# Patient Record
Sex: Male | Born: 1935 | Hispanic: No | Marital: Married | Smoking: Never smoker
Health system: Southern US, Community
[De-identification: ages and names within clinical notes are randomized; demographics above are authoritative.]

## PROBLEM LIST (undated history)

## (undated) DIAGNOSIS — R413 Other amnesia: Secondary | ICD-10-CM

## (undated) DIAGNOSIS — M1990 Unspecified osteoarthritis, unspecified site: Secondary | ICD-10-CM

## (undated) DIAGNOSIS — E785 Hyperlipidemia, unspecified: Secondary | ICD-10-CM

## (undated) DIAGNOSIS — I2510 Atherosclerotic heart disease of native coronary artery without angina pectoris: Secondary | ICD-10-CM

## (undated) DIAGNOSIS — I10 Essential (primary) hypertension: Secondary | ICD-10-CM

## (undated) HISTORY — PX: MITRAL VALVE SURGERY: SHX714

## (undated) HISTORY — PX: SHOULDER ARTHROSCOPY: SHX128

## (undated) HISTORY — PX: TONSILLECTOMY: SUR1361

## (undated) HISTORY — DX: Other amnesia: R41.3

## (undated) HISTORY — PX: EYE SURGERY: SHX253

## (undated) HISTORY — PX: CORONARY ARTERY BYPASS GRAFT: SHX141

---

## 1997-08-21 ENCOUNTER — Ambulatory Visit (HOSPITAL_COMMUNITY): Admission: RE | Admit: 1997-08-21 | Discharge: 1997-08-21 | Payer: Self-pay | Admitting: Infectious Diseases

## 1997-08-26 ENCOUNTER — Other Ambulatory Visit: Admission: RE | Admit: 1997-08-26 | Discharge: 1997-08-26 | Payer: Self-pay | Admitting: Infectious Diseases

## 1997-08-29 ENCOUNTER — Other Ambulatory Visit: Admission: RE | Admit: 1997-08-29 | Discharge: 1997-08-29 | Payer: Self-pay | Admitting: Infectious Diseases

## 1997-08-30 ENCOUNTER — Encounter: Admission: RE | Admit: 1997-08-30 | Discharge: 1997-08-30 | Payer: Self-pay | Admitting: Infectious Diseases

## 1997-09-11 ENCOUNTER — Other Ambulatory Visit: Admission: RE | Admit: 1997-09-11 | Discharge: 1997-09-11 | Payer: Self-pay | Admitting: Infectious Diseases

## 2000-05-17 HISTORY — PX: PROSTATE BIOPSY: SHX241

## 2000-05-17 HISTORY — PX: SHOULDER ARTHROSCOPY W/ ROTATOR CUFF REPAIR: SHX2400

## 2000-08-22 ENCOUNTER — Encounter: Payer: Self-pay | Admitting: Urology

## 2000-08-22 ENCOUNTER — Encounter: Admission: RE | Admit: 2000-08-22 | Discharge: 2000-08-22 | Payer: Self-pay | Admitting: Urology

## 2000-08-23 ENCOUNTER — Ambulatory Visit (HOSPITAL_BASED_OUTPATIENT_CLINIC_OR_DEPARTMENT_OTHER): Admission: RE | Admit: 2000-08-23 | Discharge: 2000-08-24 | Payer: Self-pay | Admitting: Urology

## 2007-04-24 ENCOUNTER — Encounter: Admission: RE | Admit: 2007-04-24 | Discharge: 2007-04-24 | Payer: Self-pay | Admitting: Gastroenterology

## 2008-07-24 IMAGING — CT CT VIRTUAL COLONOSCOPY SCREENING
2 of 5 series · 16 of 46 positions shown, 18 images · non-contrast
Comparison: None.

CLINICAL DATA: Some abdominal pain and diarrhea.  Patient on anticoagulation, i.e. Coumadin for mitral valve replacement which could not be stopped. 
CT VIRTUAL COLONOSCOPY SCREENING :
TECHNIQUE: The patient was given a standard low sodium bowel preparation with barium and Gastrografin for stool and fluid tagging respectively.  Automated 
CO2 insufflation of the colon was performed prior to scanning.  The quality of bowel preparation is excellent.  The quality of colonic distention is good with the exception of the sigmoid and portions of the descending colon where diverticulosis results in suboptimal distention.
TECHNIQUE: Multidetector CT imaging of the abdomen was performed following the standard protocol without IV contrast.
TECHNIQUE: Multidetector CT imaging of the pelvis was performed following the standard protocol without IV contrast.

[Series 7: prone · axial · 0.70mm/px · z∈[-414,+8]mm · 13 of 196 slices shown, 15 images]
[im 14/196  soft-tissue]
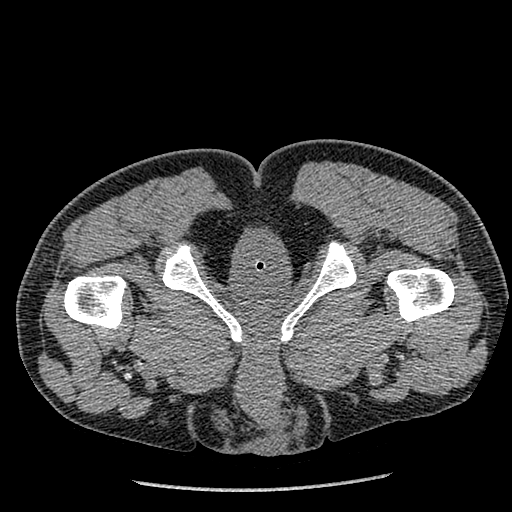
[im 14/196  bone]
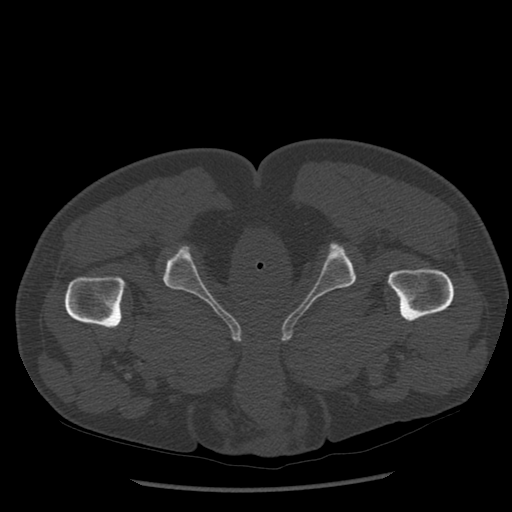
[im 27/196  soft-tissue]
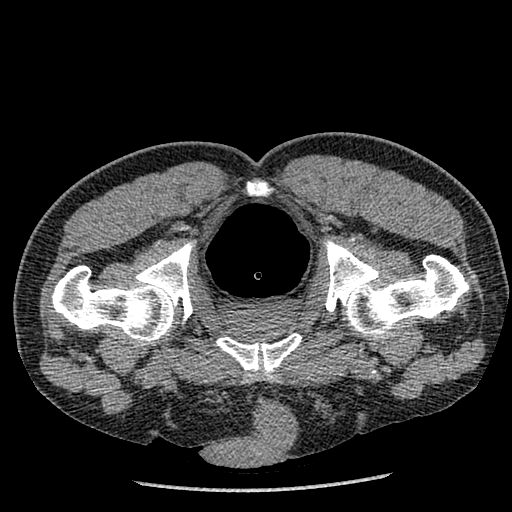
[im 40/196  soft-tissue]
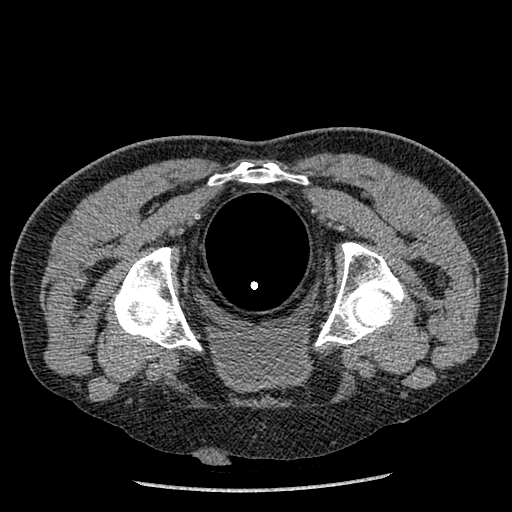
[im 53/196  soft-tissue]
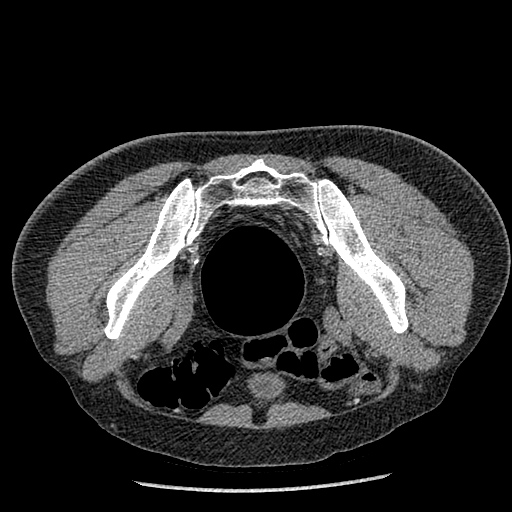
[im 66/196  soft-tissue]
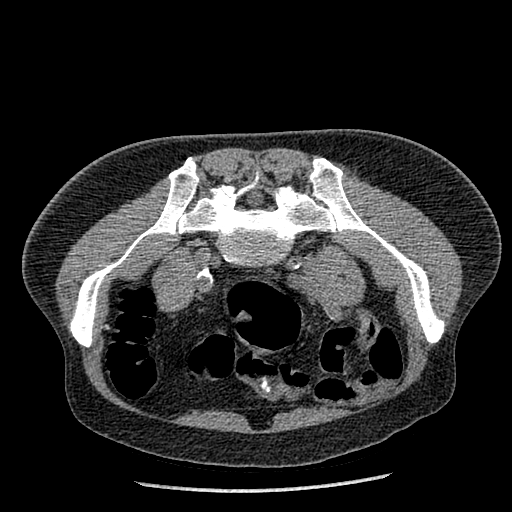
[im 79/196  soft-tissue]
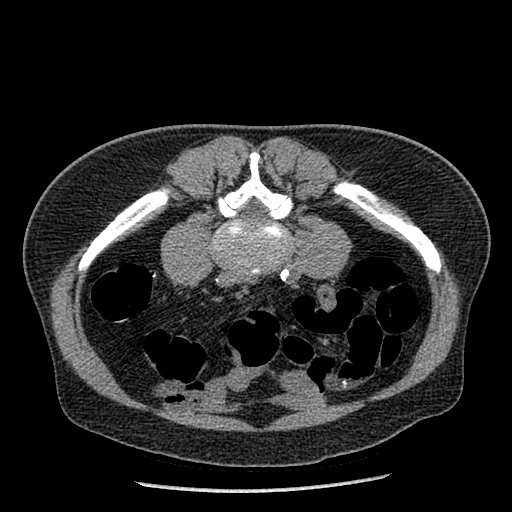
[im 105/196  soft-tissue]
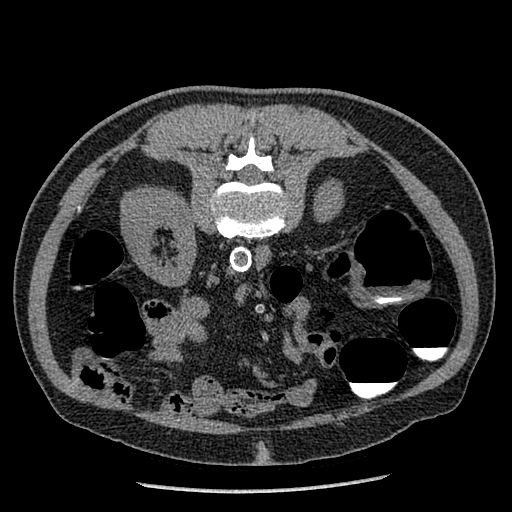
[im 118/196  soft-tissue]
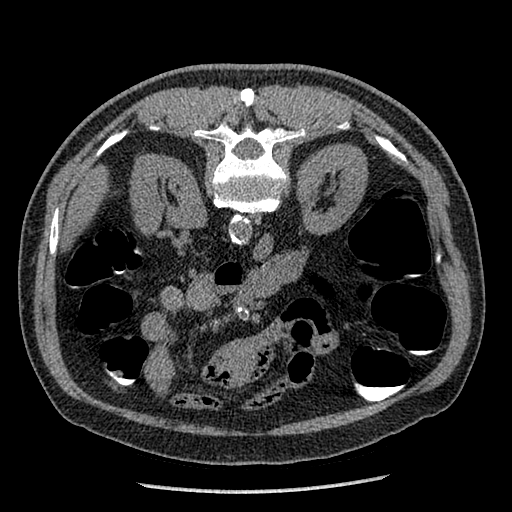
[im 131/196  soft-tissue]
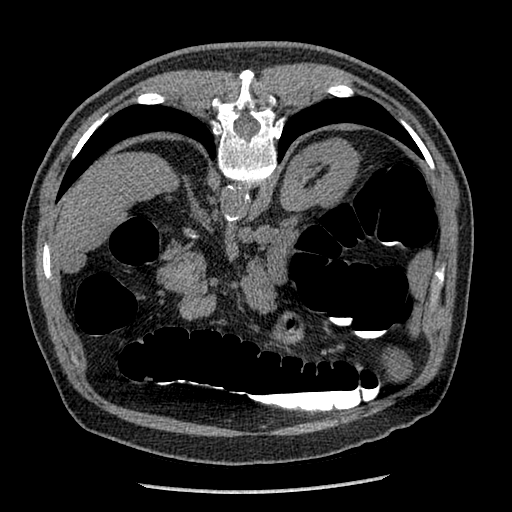
[im 131/196  bone]
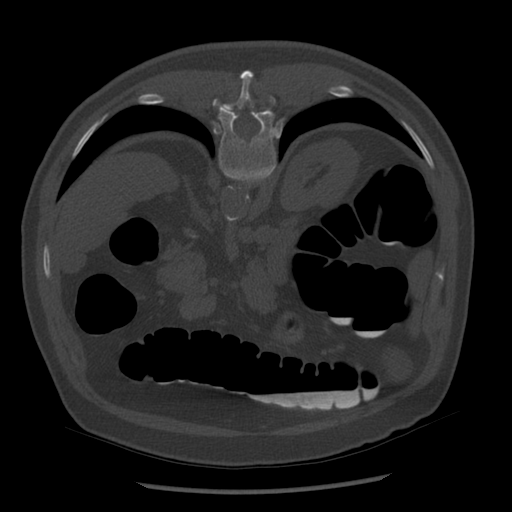
[im 144/196  soft-tissue]
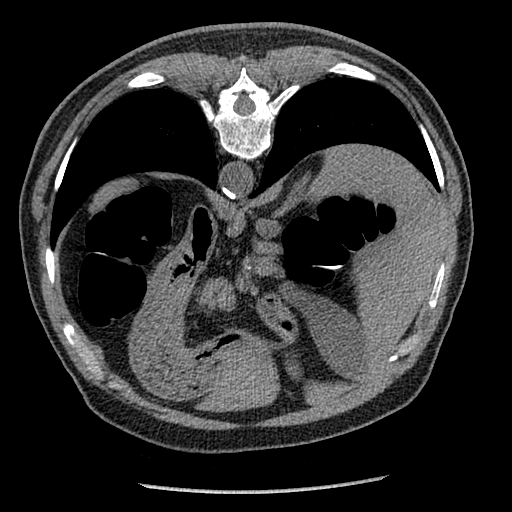
[im 157/196  soft-tissue]
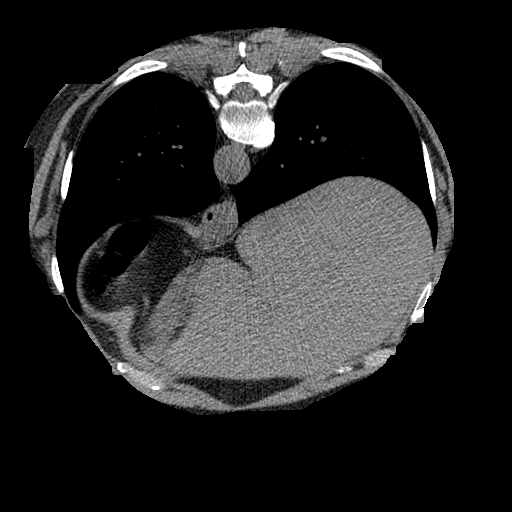
[im 170/196  soft-tissue]
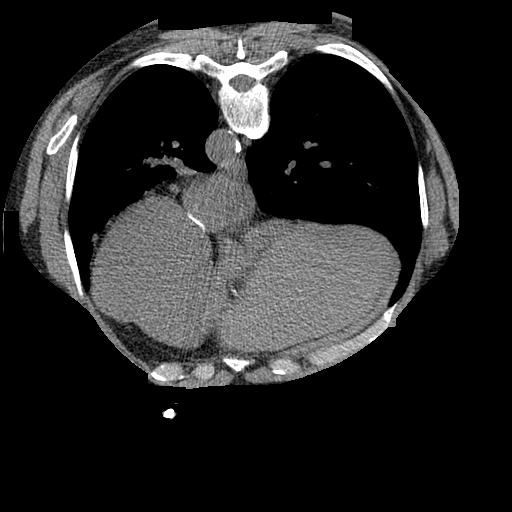
[im 183/196  soft-tissue]
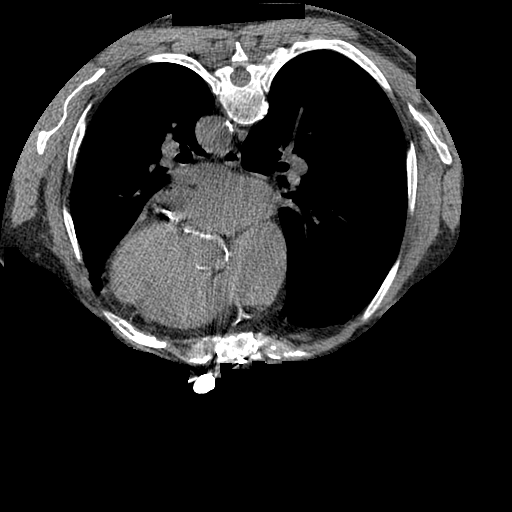

[Series 602: sagittal body · sagittal · 0.83mm/px · 3 of 142 slices shown]
[im 48/142  soft-tissue]
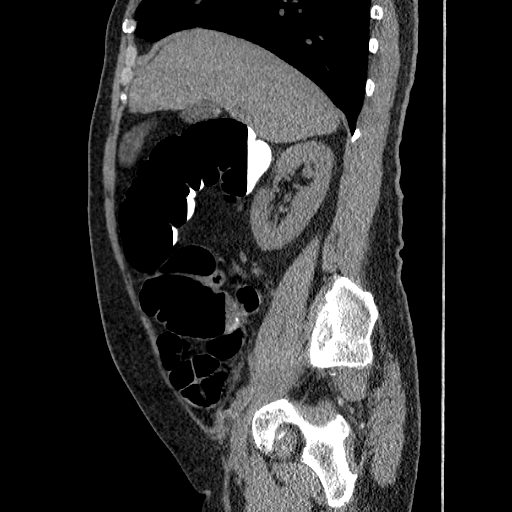
[im 63/142  soft-tissue]
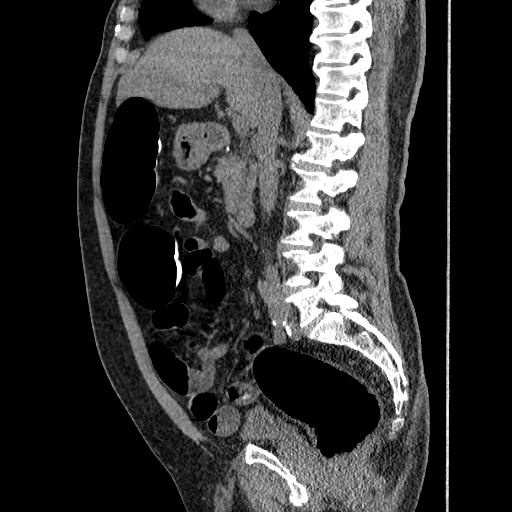
[im 79/142  soft-tissue]
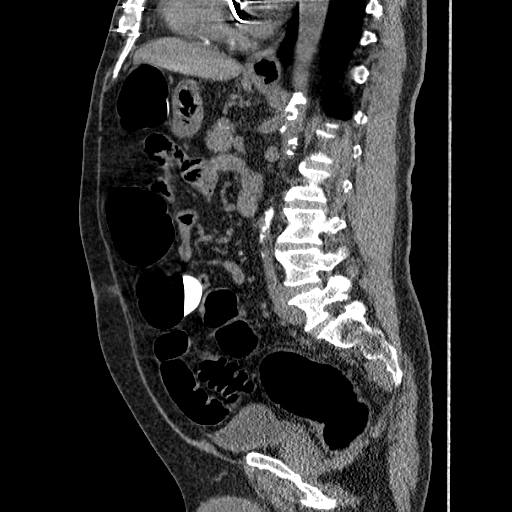

[16 of 46 positions shown; findings below may reference images not displayed]

FINDINGS: No clinically significant polyp is seen.  Diverticulosis is noted involving the sigmoid and descending colon which is not optimally distended.  However, no gross abnormality is evident on 2D or 3D images.  
Virtual colonoscopy is not designed to detect diminutive polyps (i.e., less than or equal to 5 mm), the presence or absence of which may not affect clinical management.
IMPRESSION: No clinically significant polyp.  This study is considered category C1.  Normal Colon or Benign Lesion; Continue Routine Screening
ABDOMEN CT WITHOUT CONTRAST:
FINDINGS: The lung bases are clear.  Mitral valve replacement is noted.  A small hiatal hernia is present.  The liver appears normal in the unenhanced state.  Small gallstones layer within the gallbladder.  The pancreas is normal in size and the pancreatic duct is not dilated.  The adrenal glands and spleen appear normal. The kidneys appear normal in the unenhanced state with no calculi or mass noted.  There is considerable calcification of the abdominal aorta.  The appendix fills with oral contrast and appears normal.
IMPRESSION: 1.  Small gallstones. 
2.  Small hiatal hernia.
PELVIS CT WITHOUT CONTRAST:
FINDINGS: Diverticular change is noted in the sigmoid and descending colon.  The urinary bladder is unremarkable.  The prostate is within upper limits of normal.  Pars defects are noted at L5 bilaterally, but no malalignment is seen.  Degenerative disk disease is present at L3-4.
IMPRESSION: 1.  Diverticulosis involving the sigmoid and descending colon. 
2.  Bilateral pars defects at L5.  Normal alignment.  
3.  Degenerative disk disease at L3-4.

## 2010-10-02 NOTE — Op Note (Signed)
[place]. [facility]  Patient:    Tanner Chambers, Tanner Chambers                         MRN: [id number] Proc. Date: 08/23/00 Adm. Date:  [id number] Attending:  Nelma Rothman Iii CC:         Katy Fitch. Sypher, Montez Hageman., M.D.             Dr. Gweneth Fritter, [facility], M.D.                           Operative Report  PREOPERATIVE DIAGNOSIS:  Elevated prostate specific antigen.  POSTOPERATIVE DIAGNOSIS:  Elevated prostate specific antigen.  OPERATION PERFORMED:  Transrectal ultrasound and biopsy of the prostate gland.  SURGEON:  Lucrezia Starch. Ovidio Hanger, M.D.  ANESTHESIA:  General endotracheal.  ESTIMATED BLOOD LOSS:  Negligible.  DRAINS:  None.  COMPLICATIONS:  None.  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  He subsequently has had it repeated and it was 5.7 with a 10.7 free to total ratio.  He had suffered endocarditis previously, has had coronary artery bypass grafting and a St. Jude mitral valve placed.  In addition, he required shoulder surgery and has to have anticoagulation stopped prior to proceeding.  It was elected to perform his transrectal ultrasound and biopsy and shoulder surgery concomitantly.  He has been given proper SBE prophylaxis and after understanding risks, benefits and alternatives, has elected to proceed.  DESCRIPTION OF PROCEDURE:  After proper general endotracheal anesthesia, he was placed in lithotomy position and transrectal ultrasound of the prostate was performed with the Siemens ultrasound probe at the 6 megahertz level. Scans were performed of the prostate and seminal vesicles both axially and sagittally and the prostate was noted to be 46.1 cc utilizing volumetric measurement.  There was some transitional zone hypertrophy noted. There were a few central zone calcifications noted.  There was heteroechogenicity but no hypo- or  hyperechoic nodules were noted.  The capsule was intact as were the seminal vesicle and seminal vesicle prostatic angle.  The bladder was noted to not be distended.  Utilizing the Biopty probe, eight biopsies were obtained in all, apex, midbase, laterally and one transitional zone and submitted as right and left biopsy.  The patient tolerated the procedure well.  There was no significant bleeding noted and the case was turned back over to Dr. Teressa Senter at this point. DD:  08/23/00 TD:  08/23/00 Job: [id number]

## 2010-10-02 NOTE — Op Note (Signed)
[place]. [facility]  Patient:    Tanner Chambers, Tanner Chambers                         MRN: [id number] Proc. Date: 08/23/00 Adm. Date:  [id number] Attending:  Nelma Rothman Iii CC:         Katy Fitch. Sypher, Montez Hageman., M.D. (2)   Operative Report  PREOPERATIVE DIAGNOSES:  Chronic stage 2 or 3 impingement, left shoulder, with acromioclavicular arthropathy and magnetic resonance imaging evidence of joint effusion, rule out early glenohumeral degenerative arthritis.  POSTOPERATIVE DIAGNOSES: 1. Complete rupture of the long head of the left biceps, with large    intra-articular proximal stump, impinging within the glenohumeral joint. 2. A 50% or more deep surface tear of the supraspinatus tendon, with    multiple fragments within the glenohumeral joint. 3. Early glenohumeral degenerative arthritis, with chondromalacia of    inferior anterior third of the glenoid, and calcification of glenoid    labrum. 4. Chronic acromioclavicular arthropathy.  OPERATION: 1. Diagnostic arthroscopy, left glenohumeral joint. 2. Arthroscopic debridement, proximal stump of long head of biceps rupture. 3. Arthroscopic debridement, deep surface of supraspinatus rotator cuff tear. 4. Arthroscopic acromioplasty, bursectomy, and coracoacromial ligament    resection for subacromial decompression. 5. Arthroscopic distal clavicle resection.  SURGEON:  Katy Fitch. Sypher, Montez Hageman., M.D.  ASSISTANT:  Marveen Reeks Dasnoit, P.A.-C.  ANESTHESIA:  General orotracheal supplemented by intra-scalene block.  SUPERVISING ANESTHESIOLOGIST:  Janetta Hora. Gelene Mink, M.D.  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  He is status post arthroscopic evaluation of his right glenohumeral joint in November 1994.  At that time he was noted to have approximately an 80% supraspinatus deep surface tear, and was treated with simple  decompression. He had an excellent response to his decompression and has been very active athletically since his decompression surgery.  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  He is on chronic Coumadin anticoagulation.  He has recently been noted to have an elevated PSA, and made arrangements at this time to have a transrectal ultrasound of his prostate, followed by probable biopsy of his prostate by Dr. Windy Fast L. Davis III.  Dr. Thedore Mins requested that we perform an evaluation of his left shoulder simultaneously.  Prior to our consultation, he had obtained an MRI of his left shoulder at [facility] dated June 05, 1999.  At that time he was noted to have a joint effusion and signs of a bicipital tenosynovitis.  There was no evidence noted of a rotator cuff tear.  He subsequently contacted my office and requested that we schedule him for an arthroscopic decompression; however, due to numerous scheduling conflicts, he was unable to proceed with surgery until this time.  After a lengthy consultation with Dr. Earlene Plater, it was agreed by all parties, that it would be safe to proceed with an arthroscopic surgery to the left shoulder following a transrectal biopsy of the prostate.  His cardiologist advised [place] to give him IV prophylactic vancomycin and gentamicin, to protect his valve preoperatively.  Preoperatively I had advised Dr. Thedore Mins that should we find a full-thickness rotator cuff tear, that we should probably proceed with an open repair; however, due to other scheduling conflicts with his active clinical practice,  he requested that I only decompress and debride a rotator cuff tear at this time.  We have stopped his Coumadin for one week, and he has been maintained on Lovenox as a subcutaneous  parenteral anticoagulant.  His laboratory studies were checked 24 hours prior to surgery, and his INR had corrected to normal. His electrolytes were acceptable, and his BUN and creatinine were within normal limits at 0.9 and 12.  His PTT on August 22, 2000, was noted to be 32 seconds, which was within normal range.  After a lengthy informed consent and question and answer period, he is brought to the operating room at this time, anticipating the proper management of his left shoulder predicament, short of open repair of the rotator cuff.  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  Following the induction of general orotracheal anesthesia, he was carefully positioned for appropriate urologic surgery by Dr. Earlene Plater.  Once Dr. Earlene Plater had completed his procedure and retired from the room, we carefully positioned Dr. Thedore Mins in a beach chair position with the aid of a torso and head holder designed for a shoulder arthroscopy.  The prophylactic vancomycin and gentamicin had been administered in the holding area.  The entire left upper extremity and forequarter were prepped with Duraprep and draped with impervious arthroscopy drapes.  The arthroscope was introduced through a standard posterior portal.  Diagnostic arthroscopy revealed a number of significant intra-articular pathologies, including a complete rupture of the long head of the biceps, with a 2.0 cm stump hanging within the joint, a significant calcification of the anterior labrum at approximately 4:30 oclock, and signs of moderate labral degeneration.  There was some synovitis on the deep surface of the subscapularis, but no sign of adhesive capsulitis. There was a 50%-80% tear of the deep surface of the supraspinatus tendon at its anterior and middle portions, but no sign of a full thickness rotator cuff tear noted.  The infraspinatus was also  moderately degenerative.  The glenoid had areas of chondromalacia on the anterior and inferior third, and the  superior and posterior two-thirds were essentially normal in appearance. Virtually all of the humeral head had normal-appearing hilar articular cartilage.  The superior recess had a moderate degree of synovitis.  The inferior recess was normal.  An anterior portal was created, and a suction shaver was used to debride the stump of the long head of the biceps.  The anterior calcification was debrided under direct vision, and the frayed labrum was also smoothed to a stable contour.  Photographic documentation of the pathology was completed, followed by the documentation of the correction with debridement.  The glenohumeral joint was then thoroughly flushed with sterile saline, followed by the removal of the arthroscopic equipment.  The scope was then placed in the subacromial space.  The subacromial and subdeltoid bursa were quite hypertrophic.  This was thoroughly debrided, revealing the morphology of the acromion.  He had a type 2 acromion, with a very prominent coracoacromial ligament.  The coracoacromial ligament was released with cutting cautery, and the acromion was cleared of soft tissues and leveled to a type 1 morphology using a bur from the posterior portal, and visualization laterally.  The [facility] joint was markedly degenerated and prominent.  The distal 15.0 mm of clavicle was removed arthroscopically.  A very thorough anterior, lateral, and posterior bursectomy allowed full visualization of the superficial surface of the rotator cuff,  and it was noted that there was no sign of retracted full-thickness tear.  The deep surface tear, however, was substantial, but had been thinned and will be left prominent after debridement.  In keeping with Dr. Thea Gist wishes, I did not proceed with repair of the rotator cuff, and would expect that his symptoms will be improved significantly  by decompression and debridement.  His portals were subsequently repaired with interrupted sutures of #3-0 nylon, and the wounds dressed with Xeroflo, sterile gauze, and Hypafix.  He was awakened from anesthesia, and transferred to the recovery room for observation of his vital signs.  DISPOSITION:  He will be discharged with prescriptions for Mepergan Fortis, Motrin, and Levaquin as a postoperative prophylactic antibiotic. DD:  08/23/00 TD:  08/23/00 Job: [id number]

## 2011-05-28 DIAGNOSIS — I2510 Atherosclerotic heart disease of native coronary artery without angina pectoris: Secondary | ICD-10-CM | POA: Diagnosis not present

## 2011-05-28 DIAGNOSIS — I4891 Unspecified atrial fibrillation: Secondary | ICD-10-CM | POA: Diagnosis not present

## 2011-06-10 DIAGNOSIS — H905 Unspecified sensorineural hearing loss: Secondary | ICD-10-CM | POA: Diagnosis not present

## 2011-06-15 DIAGNOSIS — I6529 Occlusion and stenosis of unspecified carotid artery: Secondary | ICD-10-CM | POA: Diagnosis not present

## 2011-06-21 DIAGNOSIS — N390 Urinary tract infection, site not specified: Secondary | ICD-10-CM | POA: Diagnosis not present

## 2011-06-28 DIAGNOSIS — Z7901 Long term (current) use of anticoagulants: Secondary | ICD-10-CM | POA: Diagnosis not present

## 2011-06-28 DIAGNOSIS — Z5181 Encounter for therapeutic drug level monitoring: Secondary | ICD-10-CM | POA: Diagnosis not present

## 2011-07-15 DIAGNOSIS — Z5181 Encounter for therapeutic drug level monitoring: Secondary | ICD-10-CM | POA: Diagnosis not present

## 2011-07-15 DIAGNOSIS — Z7901 Long term (current) use of anticoagulants: Secondary | ICD-10-CM | POA: Diagnosis not present

## 2011-07-16 DIAGNOSIS — Z954 Presence of other heart-valve replacement: Secondary | ICD-10-CM | POA: Diagnosis not present

## 2011-07-16 DIAGNOSIS — Z7901 Long term (current) use of anticoagulants: Secondary | ICD-10-CM | POA: Diagnosis not present

## 2011-07-16 DIAGNOSIS — I4891 Unspecified atrial fibrillation: Secondary | ICD-10-CM | POA: Diagnosis not present

## 2011-07-21 DIAGNOSIS — Z954 Presence of other heart-valve replacement: Secondary | ICD-10-CM | POA: Diagnosis not present

## 2011-07-21 DIAGNOSIS — Z7901 Long term (current) use of anticoagulants: Secondary | ICD-10-CM | POA: Diagnosis not present

## 2011-07-26 DIAGNOSIS — Z5181 Encounter for therapeutic drug level monitoring: Secondary | ICD-10-CM | POA: Diagnosis not present

## 2011-07-26 DIAGNOSIS — Z7901 Long term (current) use of anticoagulants: Secondary | ICD-10-CM | POA: Diagnosis not present

## 2011-07-26 DIAGNOSIS — I059 Rheumatic mitral valve disease, unspecified: Secondary | ICD-10-CM | POA: Diagnosis not present

## 2011-07-29 DIAGNOSIS — I6529 Occlusion and stenosis of unspecified carotid artery: Secondary | ICD-10-CM | POA: Diagnosis not present

## 2011-08-18 DIAGNOSIS — Z954 Presence of other heart-valve replacement: Secondary | ICD-10-CM | POA: Diagnosis not present

## 2011-09-03 DIAGNOSIS — E785 Hyperlipidemia, unspecified: Secondary | ICD-10-CM | POA: Diagnosis not present

## 2011-09-03 DIAGNOSIS — I6529 Occlusion and stenosis of unspecified carotid artery: Secondary | ICD-10-CM | POA: Diagnosis not present

## 2011-09-03 DIAGNOSIS — I2510 Atherosclerotic heart disease of native coronary artery without angina pectoris: Secondary | ICD-10-CM | POA: Diagnosis not present

## 2011-09-07 DIAGNOSIS — I2510 Atherosclerotic heart disease of native coronary artery without angina pectoris: Secondary | ICD-10-CM | POA: Diagnosis not present

## 2011-09-07 DIAGNOSIS — Z5181 Encounter for therapeutic drug level monitoring: Secondary | ICD-10-CM | POA: Diagnosis not present

## 2011-09-07 DIAGNOSIS — Z79899 Other long term (current) drug therapy: Secondary | ICD-10-CM | POA: Diagnosis not present

## 2011-09-07 DIAGNOSIS — E7800 Pure hypercholesterolemia, unspecified: Secondary | ICD-10-CM | POA: Diagnosis not present

## 2011-09-16 DIAGNOSIS — Z954 Presence of other heart-valve replacement: Secondary | ICD-10-CM | POA: Diagnosis not present

## 2011-10-21 DIAGNOSIS — Z954 Presence of other heart-valve replacement: Secondary | ICD-10-CM | POA: Diagnosis not present

## 2011-11-16 DIAGNOSIS — M722 Plantar fascial fibromatosis: Secondary | ICD-10-CM | POA: Diagnosis not present

## 2011-11-16 DIAGNOSIS — M7150 Other bursitis, not elsewhere classified, unspecified site: Secondary | ICD-10-CM | POA: Diagnosis not present

## 2011-11-18 DIAGNOSIS — Z954 Presence of other heart-valve replacement: Secondary | ICD-10-CM | POA: Diagnosis not present

## 2011-12-24 DIAGNOSIS — Z954 Presence of other heart-valve replacement: Secondary | ICD-10-CM | POA: Diagnosis not present

## 2011-12-27 DIAGNOSIS — Z79899 Other long term (current) drug therapy: Secondary | ICD-10-CM | POA: Diagnosis not present

## 2011-12-27 DIAGNOSIS — E785 Hyperlipidemia, unspecified: Secondary | ICD-10-CM | POA: Diagnosis not present

## 2012-01-13 DIAGNOSIS — G4710 Hypersomnia, unspecified: Secondary | ICD-10-CM | POA: Diagnosis not present

## 2012-01-13 DIAGNOSIS — G4730 Sleep apnea, unspecified: Secondary | ICD-10-CM | POA: Diagnosis not present

## 2012-01-19 DIAGNOSIS — G4730 Sleep apnea, unspecified: Secondary | ICD-10-CM | POA: Diagnosis not present

## 2012-01-19 DIAGNOSIS — G4710 Hypersomnia, unspecified: Secondary | ICD-10-CM | POA: Diagnosis not present

## 2012-01-21 DIAGNOSIS — H2510 Age-related nuclear cataract, unspecified eye: Secondary | ICD-10-CM | POA: Diagnosis not present

## 2012-01-21 DIAGNOSIS — Z0100 Encounter for examination of eyes and vision without abnormal findings: Secondary | ICD-10-CM | POA: Diagnosis not present

## 2012-01-28 DIAGNOSIS — Z954 Presence of other heart-valve replacement: Secondary | ICD-10-CM | POA: Diagnosis not present

## 2012-02-03 DIAGNOSIS — H2589 Other age-related cataract: Secondary | ICD-10-CM | POA: Diagnosis not present

## 2012-02-14 DIAGNOSIS — Z23 Encounter for immunization: Secondary | ICD-10-CM | POA: Diagnosis not present

## 2012-02-17 DIAGNOSIS — R1013 Epigastric pain: Secondary | ICD-10-CM | POA: Diagnosis not present

## 2012-02-18 DIAGNOSIS — R109 Unspecified abdominal pain: Secondary | ICD-10-CM | POA: Diagnosis not present

## 2012-02-18 DIAGNOSIS — K8020 Calculus of gallbladder without cholecystitis without obstruction: Secondary | ICD-10-CM | POA: Diagnosis not present

## 2012-02-24 DIAGNOSIS — M722 Plantar fascial fibromatosis: Secondary | ICD-10-CM | POA: Diagnosis not present

## 2012-02-24 DIAGNOSIS — M7150 Other bursitis, not elsewhere classified, unspecified site: Secondary | ICD-10-CM | POA: Diagnosis not present

## 2012-03-02 DIAGNOSIS — Z954 Presence of other heart-valve replacement: Secondary | ICD-10-CM | POA: Diagnosis not present

## 2012-03-30 DIAGNOSIS — Z954 Presence of other heart-valve replacement: Secondary | ICD-10-CM | POA: Diagnosis not present

## 2012-04-18 DIAGNOSIS — H43819 Vitreous degeneration, unspecified eye: Secondary | ICD-10-CM | POA: Diagnosis not present

## 2012-04-20 DIAGNOSIS — Z7901 Long term (current) use of anticoagulants: Secondary | ICD-10-CM | POA: Diagnosis not present

## 2012-04-20 DIAGNOSIS — H2589 Other age-related cataract: Secondary | ICD-10-CM | POA: Diagnosis not present

## 2012-04-26 DIAGNOSIS — I2510 Atherosclerotic heart disease of native coronary artery without angina pectoris: Secondary | ICD-10-CM | POA: Diagnosis not present

## 2012-04-26 DIAGNOSIS — E785 Hyperlipidemia, unspecified: Secondary | ICD-10-CM | POA: Diagnosis not present

## 2012-04-27 DIAGNOSIS — Z954 Presence of other heart-valve replacement: Secondary | ICD-10-CM | POA: Diagnosis not present

## 2012-04-28 DIAGNOSIS — I2510 Atherosclerotic heart disease of native coronary artery without angina pectoris: Secondary | ICD-10-CM | POA: Diagnosis not present

## 2012-04-28 DIAGNOSIS — Z79899 Other long term (current) drug therapy: Secondary | ICD-10-CM | POA: Diagnosis not present

## 2012-05-09 DIAGNOSIS — M7150 Other bursitis, not elsewhere classified, unspecified site: Secondary | ICD-10-CM | POA: Diagnosis not present

## 2012-05-09 DIAGNOSIS — M722 Plantar fascial fibromatosis: Secondary | ICD-10-CM | POA: Diagnosis not present

## 2012-05-25 DIAGNOSIS — Z954 Presence of other heart-valve replacement: Secondary | ICD-10-CM | POA: Diagnosis not present

## 2012-06-06 DIAGNOSIS — M7150 Other bursitis, not elsewhere classified, unspecified site: Secondary | ICD-10-CM | POA: Diagnosis not present

## 2012-06-06 DIAGNOSIS — M722 Plantar fascial fibromatosis: Secondary | ICD-10-CM | POA: Diagnosis not present

## 2012-06-22 DIAGNOSIS — Z954 Presence of other heart-valve replacement: Secondary | ICD-10-CM | POA: Diagnosis not present

## 2012-07-20 DIAGNOSIS — Z954 Presence of other heart-valve replacement: Secondary | ICD-10-CM | POA: Diagnosis not present

## 2012-08-20 DIAGNOSIS — Z954 Presence of other heart-valve replacement: Secondary | ICD-10-CM | POA: Diagnosis not present

## 2012-09-05 DIAGNOSIS — M24573 Contracture, unspecified ankle: Secondary | ICD-10-CM | POA: Diagnosis not present

## 2012-09-05 DIAGNOSIS — M722 Plantar fascial fibromatosis: Secondary | ICD-10-CM | POA: Diagnosis not present

## 2012-09-14 DIAGNOSIS — I4891 Unspecified atrial fibrillation: Secondary | ICD-10-CM | POA: Diagnosis not present

## 2012-09-14 DIAGNOSIS — Z7901 Long term (current) use of anticoagulants: Secondary | ICD-10-CM | POA: Diagnosis not present

## 2012-09-14 DIAGNOSIS — I059 Rheumatic mitral valve disease, unspecified: Secondary | ICD-10-CM | POA: Diagnosis not present

## 2012-09-26 DIAGNOSIS — M7150 Other bursitis, not elsewhere classified, unspecified site: Secondary | ICD-10-CM | POA: Diagnosis not present

## 2012-09-26 DIAGNOSIS — M722 Plantar fascial fibromatosis: Secondary | ICD-10-CM | POA: Diagnosis not present

## 2012-09-29 DIAGNOSIS — Z954 Presence of other heart-valve replacement: Secondary | ICD-10-CM | POA: Diagnosis not present

## 2012-10-05 DIAGNOSIS — Z1331 Encounter for screening for depression: Secondary | ICD-10-CM | POA: Diagnosis not present

## 2012-10-05 DIAGNOSIS — M109 Gout, unspecified: Secondary | ICD-10-CM | POA: Diagnosis not present

## 2012-10-05 DIAGNOSIS — Z9181 History of falling: Secondary | ICD-10-CM | POA: Diagnosis not present

## 2012-10-05 DIAGNOSIS — E785 Hyperlipidemia, unspecified: Secondary | ICD-10-CM | POA: Diagnosis not present

## 2012-10-05 DIAGNOSIS — I10 Essential (primary) hypertension: Secondary | ICD-10-CM | POA: Diagnosis not present

## 2012-10-05 DIAGNOSIS — K219 Gastro-esophageal reflux disease without esophagitis: Secondary | ICD-10-CM | POA: Diagnosis not present

## 2012-10-30 DIAGNOSIS — M19049 Primary osteoarthritis, unspecified hand: Secondary | ICD-10-CM | POA: Diagnosis not present

## 2012-10-30 DIAGNOSIS — M6530 Trigger finger, unspecified finger: Secondary | ICD-10-CM | POA: Diagnosis not present

## 2012-10-31 ENCOUNTER — Other Ambulatory Visit: Payer: Self-pay | Admitting: Orthopedic Surgery

## 2012-11-02 ENCOUNTER — Encounter (HOSPITAL_BASED_OUTPATIENT_CLINIC_OR_DEPARTMENT_OTHER)
Admission: RE | Admit: 2012-11-02 | Discharge: 2012-11-02 | Disposition: A | Discharge status: Home / Self Care | Payer: Medicare Other | Source: Ambulatory Visit | Attending: Orthopedic Surgery | Admitting: Orthopedic Surgery

## 2012-11-02 ENCOUNTER — Encounter (HOSPITAL_BASED_OUTPATIENT_CLINIC_OR_DEPARTMENT_OTHER): Payer: Self-pay | Admitting: *Deleted

## 2012-11-02 DIAGNOSIS — E785 Hyperlipidemia, unspecified: Secondary | ICD-10-CM | POA: Diagnosis not present

## 2012-11-02 DIAGNOSIS — M6530 Trigger finger, unspecified finger: Secondary | ICD-10-CM | POA: Diagnosis not present

## 2012-11-02 DIAGNOSIS — Z954 Presence of other heart-valve replacement: Secondary | ICD-10-CM | POA: Diagnosis not present

## 2012-11-02 DIAGNOSIS — I2510 Atherosclerotic heart disease of native coronary artery without angina pectoris: Secondary | ICD-10-CM | POA: Diagnosis not present

## 2012-11-02 DIAGNOSIS — I10 Essential (primary) hypertension: Secondary | ICD-10-CM | POA: Diagnosis not present

## 2012-11-02 DIAGNOSIS — M129 Arthropathy, unspecified: Secondary | ICD-10-CM | POA: Diagnosis not present

## 2012-11-02 DIAGNOSIS — Z880 Allergy status to penicillin: Secondary | ICD-10-CM | POA: Diagnosis not present

## 2012-11-02 DIAGNOSIS — Z7901 Long term (current) use of anticoagulants: Secondary | ICD-10-CM | POA: Diagnosis not present

## 2012-11-02 LAB — BASIC METABOLIC PANEL
CO2: 27 mEq/L (ref 19–32)
Calcium: 9.1 mg/dL (ref 8.4–10.5)
Creatinine, Ser: 0.75 mg/dL (ref 0.50–1.35)
GFR calc non Af Amer: 88 mL/min — ABNORMAL LOW (ref >90)
Glucose, Bld: 83 mg/dL (ref 70–99)
Sodium: 137 mEq/L (ref 135–145)

## 2012-11-02 NOTE — Progress Notes (Signed)
ekg cleared by dr Ladene Artist

## 2012-11-02 NOTE — Progress Notes (Signed)
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-

## 2012-11-03 DIAGNOSIS — Z954 Presence of other heart-valve replacement: Secondary | ICD-10-CM | POA: Diagnosis not present

## 2012-11-06 NOTE — H&P (Signed)
  Tanner Chambers is an 77 y.o. male.   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 He is 46 years of age and right hand dominant. He is s/p mitral valve replacement after an episode of endocarditis treated by Lovett Sox, III MD with mitral valve replacement. Tanner Chambers is on chronic Warfarin anticoagulation maintaining an INR of 3.5 and 3.9. He tests his INR at home. Dr. 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 We have followed this for years. He also had a left ring trigger finger which he injected at home times 2. It no longer locks but is quite sensitive and swollen over the A-1 pulley. He presents for a follow up consult  Past Medical History  Diagnosis Date  . Coronary artery disease   . Hypertension   . Hyperlipemia   . Arthritis     Past Surgical History  Procedure Laterality Date  . Coronary artery bypass graft    . Mitral valve surgery    . Eye surgery      both cataracts  . Tonsillectomy    . Prostate biopsy  2002  . Shoulder arthroscopy w/ rotator cuff repair  2002    left-dsc  . Shoulder arthroscopy      right    History reviewed. No pertinent family history. Social History:  reports that he has never smoked. He does not have any smokeless tobacco history on file. He reports that he does not drink alcohol or use illicit drugs.  Allergies:  Allergies  Allergen Reactions  . Penicillins     No prescriptions prior to admission    No results found for this or any previous visit (from the past 48 hour(s)).  No results found.   Pertinent items are noted in HPI.  Height 5\' 5"  (1.651 m), weight 64.864 kg (143 lb).  General appearance: alert Head: Normocephalic, without obvious abnormality Neck: supple, symmetrical, trachea midline Resp: clear to auscultation bilaterally Cardio: regular rate and rhythm GI:  normal findings: bowel sounds normal Extremities: There is swelling over his left ring finger flexor sheath and tenderness over the A-1 pulley. He is not actively locking but does have a swollen tendon. It is possible he has a degree of tendinopathy following the multiple injections. His motor and sensory exam is intact bilaterally. His pulses and capillary refill are normal.  Pulses: 2+ and symmetric Skin: normal Neurologic: Grossly normal    Assessment/Plan Impression: Chronic STS left ring finger.  Dr Thedore Mins, with the counsel of his cardiologist, is tapering his warfarin in the perioperative period.  He has a protime/INR testing set at home and will taper his INR to 2.0 for surgery.  He will resume full warfarin anticoagulation immediately post op.  Plan: TO the OR for release A-1 pulley left ring finger.The procedure, risks,benefits and post-op course were discussed with the patient at length and they were in agreement with the plan.  DASNOIT,Arlene Genova J 11/06/2012, 2:57 PM  H&P documentation: 11/07/2012  -History and Physical Reviewed  -Patient has been re-examined  -No change in the plan of care  Wyn Forster, MD

## 2012-11-07 ENCOUNTER — Encounter (HOSPITAL_BASED_OUTPATIENT_CLINIC_OR_DEPARTMENT_OTHER): Payer: Self-pay | Admitting: Orthopedic Surgery

## 2012-11-07 ENCOUNTER — Ambulatory Visit (HOSPITAL_BASED_OUTPATIENT_CLINIC_OR_DEPARTMENT_OTHER)
Admission: RE | Admit: 2012-11-07 | Discharge: 2012-11-07 | Disposition: A | Discharge status: Home / Self Care | Payer: Medicare Other | Source: Ambulatory Visit | Attending: Orthopedic Surgery | Admitting: Orthopedic Surgery

## 2012-11-07 ENCOUNTER — Encounter (HOSPITAL_BASED_OUTPATIENT_CLINIC_OR_DEPARTMENT_OTHER): Payer: Self-pay | Admitting: Anesthesiology

## 2012-11-07 ENCOUNTER — Ambulatory Visit (HOSPITAL_BASED_OUTPATIENT_CLINIC_OR_DEPARTMENT_OTHER): Payer: Medicare Other | Admitting: Anesthesiology

## 2012-11-07 ENCOUNTER — Encounter (HOSPITAL_BASED_OUTPATIENT_CLINIC_OR_DEPARTMENT_OTHER)
Admission: RE | Disposition: A | Discharge status: Home / Self Care | Payer: Self-pay | Source: Ambulatory Visit | Attending: Orthopedic Surgery

## 2012-11-07 DIAGNOSIS — Z7901 Long term (current) use of anticoagulants: Secondary | ICD-10-CM | POA: Insufficient documentation

## 2012-11-07 DIAGNOSIS — M129 Arthropathy, unspecified: Secondary | ICD-10-CM | POA: Insufficient documentation

## 2012-11-07 DIAGNOSIS — I2510 Atherosclerotic heart disease of native coronary artery without angina pectoris: Secondary | ICD-10-CM | POA: Insufficient documentation

## 2012-11-07 DIAGNOSIS — E785 Hyperlipidemia, unspecified: Secondary | ICD-10-CM | POA: Insufficient documentation

## 2012-11-07 DIAGNOSIS — Z880 Allergy status to penicillin: Secondary | ICD-10-CM | POA: Insufficient documentation

## 2012-11-07 DIAGNOSIS — I10 Essential (primary) hypertension: Secondary | ICD-10-CM | POA: Insufficient documentation

## 2012-11-07 DIAGNOSIS — M6530 Trigger finger, unspecified finger: Secondary | ICD-10-CM | POA: Diagnosis not present

## 2012-11-07 DIAGNOSIS — Z954 Presence of other heart-valve replacement: Secondary | ICD-10-CM | POA: Diagnosis not present

## 2012-11-07 HISTORY — PX: TRIGGER FINGER RELEASE: SHX641

## 2012-11-07 HISTORY — DX: Essential (primary) hypertension: I10

## 2012-11-07 HISTORY — DX: Atherosclerotic heart disease of native coronary artery without angina pectoris: I25.10

## 2012-11-07 HISTORY — DX: Unspecified osteoarthritis, unspecified site: M19.90

## 2012-11-07 HISTORY — DX: Hyperlipidemia, unspecified: E78.5

## 2012-11-07 LAB — POCT HEMOGLOBIN-HEMACUE: Hemoglobin: 14.1 g/dL (ref 13.0–17.0)

## 2012-11-07 SURGERY — RELEASE, A1 PULLEY, FOR TRIGGER FINGER
Anesthesia: General | Site: Hand | Laterality: Left | Wound class: Clean

## 2012-11-07 MED ORDER — LIDOCAINE HCL (CARDIAC) 20 MG/ML IV SOLN
INTRAVENOUS | Status: DC | PRN
  Administered 2012-11-07: 50 mg via INTRAVENOUS

## 2012-11-07 MED ORDER — ONDANSETRON HCL 4 MG/2ML IJ SOLN: 4.0000 mg | Freq: Once | INTRAMUSCULAR | Status: DC | PRN

## 2012-11-07 MED ORDER — PROPOFOL 10 MG/ML IV BOLUS
INTRAVENOUS | Status: DC | PRN
  Administered 2012-11-07: 160 mg via INTRAVENOUS

## 2012-11-07 MED ORDER — LACTATED RINGERS IV SOLN
INTRAVENOUS | Status: DC
  Administered 2012-11-07: 08:00:00 via INTRAVENOUS

## 2012-11-07 MED ORDER — FENTANYL CITRATE 0.05 MG/ML IJ SOLN
INTRAMUSCULAR | Status: DC | PRN
  Administered 2012-11-07: 25 ug via INTRAVENOUS
  Administered 2012-11-07: 50 ug via INTRAVENOUS

## 2012-11-07 MED ORDER — CHLORHEXIDINE GLUCONATE 4 % EX LIQD
60.0000 mL | Freq: Once | CUTANEOUS | Status: DC
Start: 2012-11-07 — End: 2012-11-07

## 2012-11-07 MED ORDER — PHENYLEPHRINE HCL 10 MG/ML IJ SOLN
INTRAMUSCULAR | Status: DC | PRN
  Administered 2012-11-07: 100 ug via INTRAVENOUS

## 2012-11-07 MED ORDER — HYDROMORPHONE HCL PF 1 MG/ML IJ SOLN: 0.2500 mg | INTRAMUSCULAR | Status: DC | PRN

## 2012-11-07 MED ORDER — FENTANYL CITRATE 0.05 MG/ML IJ SOLN: 50.0000 ug | INTRAMUSCULAR | Status: DC | PRN

## 2012-11-07 MED ORDER — ACETAMINOPHEN 10 MG/ML IV SOLN: 1000.0000 mg | Freq: Once | INTRAVENOUS | Status: DC | PRN

## 2012-11-07 MED ORDER — OXYCODONE-ACETAMINOPHEN 5-325 MG PO TABS
ORAL_TABLET | ORAL | Status: DC
Start: 2012-11-07 — End: 2014-05-24

## 2012-11-07 MED ORDER — MIDAZOLAM HCL 2 MG/2ML IJ SOLN: 1.0000 mg | INTRAMUSCULAR | Status: DC | PRN

## 2012-11-07 MED ORDER — ONDANSETRON HCL 4 MG/2ML IJ SOLN
INTRAMUSCULAR | Status: DC | PRN
  Administered 2012-11-07: 4 mg via INTRAVENOUS

## 2012-11-07 MED ORDER — LIDOCAINE HCL 2 % IJ SOLN
INTRAMUSCULAR | Status: DC | PRN
  Administered 2012-11-07: 1.5 mL

## 2012-11-07 MED ORDER — DEXAMETHASONE SODIUM PHOSPHATE 4 MG/ML IJ SOLN
INTRAMUSCULAR | Status: DC | PRN
  Administered 2012-11-07: 10 mg via INTRAVENOUS

## 2012-11-07 SURGICAL SUPPLY — 35 items
BANDAGE ADHESIVE 1X3 (GAUZE/BANDAGES/DRESSINGS) IMPLANT
BANDAGE ELASTIC 3 VELCRO ST LF (GAUZE/BANDAGES/DRESSINGS) ×2 IMPLANT
BLADE SURG 15 STRL LF DISP TIS (BLADE) ×1 IMPLANT
BLADE SURG 15 STRL SS (BLADE) ×2
BNDG CMPR 9X4 STRL LF SNTH (GAUZE/BANDAGES/DRESSINGS) ×1
BNDG ESMARK 4X9 LF (GAUZE/BANDAGES/DRESSINGS) ×1 IMPLANT
BRUSH SCRUB EZ PLAIN DRY (MISCELLANEOUS) ×2 IMPLANT
CLOTH BEACON ORANGE TIMEOUT ST (SAFETY) ×2 IMPLANT
CORDS BIPOLAR (ELECTRODE) ×1 IMPLANT
COVER MAYO STAND STRL (DRAPES) ×2 IMPLANT
COVER TABLE BACK 60X90 (DRAPES) ×2 IMPLANT
CUFF TOURNIQUET SINGLE 18IN (TOURNIQUET CUFF) IMPLANT
DECANTER SPIKE VIAL GLASS SM (MISCELLANEOUS) IMPLANT
DRAPE EXTREMITY T 121X128X90 (DRAPE) ×2 IMPLANT
DRAPE SURG 17X23 STRL (DRAPES) ×2 IMPLANT
GLOVE BIO SURGEON STRL SZ 6.5 (GLOVE) ×1 IMPLANT
GLOVE BIOGEL M STRL SZ7.5 (GLOVE) ×1 IMPLANT
GLOVE ORTHO TXT STRL SZ7.5 (GLOVE) ×2 IMPLANT
GOWN BRE IMP PREV XXLGXLNG (GOWN DISPOSABLE) ×2 IMPLANT
GOWN PREVENTION PLUS XLARGE (GOWN DISPOSABLE) ×2 IMPLANT
NEEDLE 27GAX1X1/2 (NEEDLE) IMPLANT
PACK BASIN DAY SURGERY FS (CUSTOM PROCEDURE TRAY) ×2 IMPLANT
PADDING CAST ABS 4INX4YD NS (CAST SUPPLIES) ×1
PADDING CAST ABS COTTON 4X4 ST (CAST SUPPLIES) ×1 IMPLANT
SPONGE GAUZE 4X4 12PLY (GAUZE/BANDAGES/DRESSINGS) ×2 IMPLANT
STOCKINETTE 4X48 STRL (DRAPES) ×2 IMPLANT
STRIP CLOSURE SKIN 1/2X4 (GAUZE/BANDAGES/DRESSINGS) IMPLANT
SUT ETHILON 5 0 P 3 18 (SUTURE)
SUT NYLON ETHILON 5-0 P-3 1X18 (SUTURE) IMPLANT
SUT PROLENE 3 0 PS 2 (SUTURE) IMPLANT
SUT PROLENE 4 0 P 3 18 (SUTURE) ×1 IMPLANT
SYR 3ML 23GX1 SAFETY (SYRINGE) IMPLANT
SYR CONTROL 10ML LL (SYRINGE) IMPLANT
TRAY DSU PREP LF (CUSTOM PROCEDURE TRAY) ×2 IMPLANT
UNDERPAD 30X30 INCONTINENT (UNDERPADS AND DIAPERS) ×2 IMPLANT

## 2012-11-07 NOTE — Op Note (Signed)
[id number] 

## 2012-11-07 NOTE — Anesthesia Postprocedure Evaluation (Signed)
  Anesthesia Post-op Note  Patient: Tanner Chambers  Procedure(s) Performed: Procedure(s): RELEASE TRIGGER FINGER/A-1 PULLEY LEFT RING (Left)  Patient Location: PACU  Anesthesia Type:General  Level of Consciousness: awake  Airway and Oxygen Therapy: Patient Spontanous Breathing  Post-op Pain: none  Post-op Assessment: Post-op Vital signs reviewed, Patient's Cardiovascular Status Stable, Respiratory Function Stable, Patent Airway, No signs of Nausea or vomiting and Pain level controlled  Post-op Vital Signs: stable  Complications: No apparent anesthesia complications

## 2012-11-07 NOTE — Anesthesia Preprocedure Evaluation (Signed)
Anesthesia Evaluation  Patient identified by MRN, date of birth, ID band Patient awake    Reviewed: Allergy & Precautions, H&P , NPO status , Patient's Chart, lab work & pertinent test results  Airway Mallampati: II      Dental  (+) Teeth Intact and Dental Advisory Given   Pulmonary  breath sounds clear to auscultation        Cardiovascular Rhythm:Regular Rate:Normal + Systolic Click    Neuro/Psych    GI/Hepatic   Endo/Other    Renal/GU      Musculoskeletal   Abdominal   Peds  Hematology   Anesthesia Other Findings   Reproductive/Obstetrics                           Anesthesia Physical Anesthesia Plan  ASA: III  Anesthesia Plan: General   Post-op Pain Management:    Induction: Intravenous  Airway Management Planned: LMA  Additional Equipment:   Intra-op Plan:   Post-operative Plan:   Informed Consent: I have reviewed the patients History and Physical, chart, labs and discussed the procedure including the risks, benefits and alternatives for the proposed anesthesia with the patient or authorized representative who has indicated his/her understanding and acceptance.   Dental advisory given  Plan Discussed with: CRNA, Anesthesiologist and Surgeon  Anesthesia Plan Comments:         Anesthesia Quick Evaluation

## 2012-11-07 NOTE — Transfer of Care (Signed)
Immediate Anesthesia Transfer of Care Note  Patient: Tanner Chambers  Procedure(s) Performed: Procedure(s): RELEASE TRIGGER FINGER/A-1 PULLEY LEFT RING (Left)  Patient Location: PACU  Anesthesia Type:General  Level of Consciousness: sedated  Airway & Oxygen Therapy: Patient Spontanous Breathing and Patient connected to face mask oxygen  Post-op Assessment: Report given to PACU RN and Post -op Vital signs reviewed and stable  Post vital signs: Reviewed and stable  Complications: No apparent anesthesia complications

## 2012-11-07 NOTE — Brief Op Note (Signed)
11/07/2012  9:17 AM  PATIENT:  Tanner Chambers  77 y.o. male  PRE-OPERATIVE DIAGNOSIS:  LOCKING STENOSING TENOSYNOVITIS LEFT RING  POST-OPERATIVE DIAGNOSIS:  LOCKING STENOSING TENOSYNOVITIS LEFT RING  PROCEDURE:  Procedure(s): RELEASE TRIGGER FINGER/A-1 PULLEY LEFT RING (Left)  SURGEON:  Surgeon(s) and Role:    * Wyn Forster., MD - Primary  PHYSICIAN ASSISTANT:   ASSISTANTS: Surgical technician  ANESTHESIA:   general  EBL:  Total I/O In: 800 [I.V.:800] Out: -   BLOOD ADMINISTERED:none  DRAINS: none   LOCAL MEDICATIONS USED:  XYLOCAINE   SPECIMEN:  No Specimen  DISPOSITION OF SPECIMEN:  N/A  COUNTS:  YES  TOURNIQUET:   Total Tourniquet Time Documented: Upper Arm (Left) - 11 minutes Total: Upper Arm (Left) - 11 minutes   DICTATION: .Other Dictation: Dictation Number [id number]  PLAN OF CARE: Discharge to home after PACU  PATIENT DISPOSITION:  PACU - hemodynamically stable.   Delay start of Pharmacological VTE agent (>24hrs) due to surgical blood loss or risk of bleeding: not applicable

## 2012-11-07 NOTE — Op Note (Signed)
[name], Chambers                ACCOUNT NO.:  [id number]  MEDICAL RECORD NO.:  [id number]  LOCATION:                                 FACILITY:  PHYSICIAN:  Katy Fitch. Renell Allum, M.D. DATE OF BIRTH:  1935-07-21  DATE OF PROCEDURE:  11/07/2012 DATE OF DISCHARGE:  11/07/2012                              OPERATIVE REPORT   PREOPERATIVE DIAGNOSIS:  Chronic left ring finger stenosing tenosynovitis, unresponsive to injection.  POSTOPERATIVE DIAGNOSIS:  Chronic left ring finger stenosing tenosynovitis, unresponsive to injection.  OPERATION:  Release of left ring finger A1 and A0 pulleys with incidental synovectomy.  OPERATING SURGEON:  Katy Fitch. Tanner Chambers, M.D.  ASSISTANT:  Surgical technician.  ANESTHESIA:  General by LMA.  SUPERVISING ANESTHESIOLOGIST:  Guadalupe Maple, M.D.  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  Dr. Thedore Chambers has been managing a left ring finger stenosing tenosynovitis for months.  He decided to inject his own finger in [place] x2 and has had no relief of his symptoms.  He has consulted at our office on 2 occasions requesting that we proceed with release of his left ring finger A1 pulley.  Dr. Thedore Chambers has a complex medical history of coronary artery disease, a history of mitral valve replacement following endocarditis and is on chronic Coumadin anticoagulation.  He is under the care of the [facility] in [place].  After consultation with his cardiologist, he decided not to alter his Coumadin anticoagulation for surgery.  There had been some discussion about whether or not he should go on Lovenox versus being maintained on warfarin.  We elected to proceed with a very minor procedure in his hand at this time while fully anticoagulated.  Our plan is to meticulously provide bipolar hemostasis as well as prolonged wound compression following  surgery.  After detailed informed consent, he is brought to the operating room at this time.  He was interviewed in the holding area and his left ring finger marked per the proper surgical site identification protocol.  He is interviewed by Dr. Noreene Larsson who recommended proceeding with general anesthesia by LMA technique.  PROCEDURE:  Beaumont Austad was brought to room 2 of the [facility] and placed in a supine position on the operative table.  Under Dr. Morley Kos direct supervision, general anesthesia by LMA technique was induced.  The left arm was then prepped with Betadine soap and solution, sterilely draped.  A pneumatic tourniquet was applied to the proximally left brachium.  Following exsanguination of left arm with Esmarch bandage, an arterial tourniquet on the proximal brachium was inflated 220 mmHg.  Routine surgical time-out was accomplished.  Procedure commenced with an extensile oblique incision directly over the thickened A1 pulley.  Subcutaneous tissues were carefully divided taking care to identify hypertrophic palmar fascia.  This was resected.  The A1 pulley was isolated and a small ganglion cyst at the distal margin of the pulley removed with a rongeur.  The pulley was split with scalpel scissors and there was noted to be a well-defined A0 pulley.  This was approximately 3 mm in width.  This was directly visualized and released  with scissors.  Flexor tendons were delivered and found to be invested with a thick cuff of fibrotic tenosynovium.  This was removed with scissor dissection and a micro rongeur.  Thereafter free passive range of motion of the finger was recovered.  The wound was then inspected for bleeding points, which were electrocauterized with bipolar forceps followed by repair of the skin with intradermal 4-0 Prolene suture.  The tourniquet was released and immediate capillary refill was noted of the thumb and all fingers.  We held  compression on the wound for 5 minutes on the clock.  The wound was then dressed with Steri-Strips, sterile gauze, and a compression of Coban dressing.  The wound was infiltrated with 2% lidocaine for postoperative analgesia.  For aftercare, he is provided prescription for Percocet 5 mg 1 p.o. q.4- 6 hours p.r.n. pain, 20 tablets without refill.     Katy Fitch Clovia Reine, M.D.    RVS/MEDQ  D:  11/07/2012  T:  11/07/2012  Job:  [id number]  cc:   [facility] in [place]

## 2012-11-08 ENCOUNTER — Encounter (HOSPITAL_BASED_OUTPATIENT_CLINIC_OR_DEPARTMENT_OTHER): Payer: Self-pay | Admitting: Orthopedic Surgery

## 2012-11-17 DIAGNOSIS — N390 Urinary tract infection, site not specified: Secondary | ICD-10-CM | POA: Diagnosis not present

## 2012-11-20 DIAGNOSIS — H26499 Other secondary cataract, unspecified eye: Secondary | ICD-10-CM | POA: Diagnosis not present

## 2012-12-06 DIAGNOSIS — L723 Sebaceous cyst: Secondary | ICD-10-CM | POA: Diagnosis not present

## 2012-12-07 DIAGNOSIS — Z954 Presence of other heart-valve replacement: Secondary | ICD-10-CM | POA: Diagnosis not present

## 2012-12-12 DIAGNOSIS — M722 Plantar fascial fibromatosis: Secondary | ICD-10-CM | POA: Diagnosis not present

## 2012-12-21 DIAGNOSIS — N390 Urinary tract infection, site not specified: Secondary | ICD-10-CM | POA: Diagnosis not present

## 2012-12-25 DIAGNOSIS — Z79899 Other long term (current) drug therapy: Secondary | ICD-10-CM | POA: Diagnosis not present

## 2012-12-25 DIAGNOSIS — I2510 Atherosclerotic heart disease of native coronary artery without angina pectoris: Secondary | ICD-10-CM | POA: Diagnosis not present

## 2012-12-26 DIAGNOSIS — M76829 Posterior tibial tendinitis, unspecified leg: Secondary | ICD-10-CM | POA: Diagnosis not present

## 2012-12-26 DIAGNOSIS — M722 Plantar fascial fibromatosis: Secondary | ICD-10-CM | POA: Diagnosis not present

## 2012-12-27 DIAGNOSIS — I2510 Atherosclerotic heart disease of native coronary artery without angina pectoris: Secondary | ICD-10-CM | POA: Diagnosis not present

## 2012-12-27 DIAGNOSIS — I6529 Occlusion and stenosis of unspecified carotid artery: Secondary | ICD-10-CM | POA: Diagnosis not present

## 2012-12-27 DIAGNOSIS — E785 Hyperlipidemia, unspecified: Secondary | ICD-10-CM | POA: Diagnosis not present

## 2013-01-04 DIAGNOSIS — Z954 Presence of other heart-valve replacement: Secondary | ICD-10-CM | POA: Diagnosis not present

## 2013-01-16 DIAGNOSIS — I6529 Occlusion and stenosis of unspecified carotid artery: Secondary | ICD-10-CM | POA: Diagnosis not present

## 2013-01-16 DIAGNOSIS — I059 Rheumatic mitral valve disease, unspecified: Secondary | ICD-10-CM | POA: Diagnosis not present

## 2013-01-16 DIAGNOSIS — E785 Hyperlipidemia, unspecified: Secondary | ICD-10-CM | POA: Diagnosis not present

## 2013-01-16 DIAGNOSIS — I2510 Atherosclerotic heart disease of native coronary artery without angina pectoris: Secondary | ICD-10-CM | POA: Diagnosis not present

## 2013-01-22 DIAGNOSIS — Z7901 Long term (current) use of anticoagulants: Secondary | ICD-10-CM | POA: Diagnosis not present

## 2013-01-22 DIAGNOSIS — I059 Rheumatic mitral valve disease, unspecified: Secondary | ICD-10-CM | POA: Diagnosis not present

## 2013-01-26 DIAGNOSIS — I059 Rheumatic mitral valve disease, unspecified: Secondary | ICD-10-CM | POA: Diagnosis not present

## 2013-01-26 DIAGNOSIS — Z23 Encounter for immunization: Secondary | ICD-10-CM | POA: Diagnosis not present

## 2013-01-26 DIAGNOSIS — Z7901 Long term (current) use of anticoagulants: Secondary | ICD-10-CM | POA: Diagnosis not present

## 2013-01-27 DIAGNOSIS — Z79899 Other long term (current) drug therapy: Secondary | ICD-10-CM | POA: Diagnosis not present

## 2013-01-27 DIAGNOSIS — I059 Rheumatic mitral valve disease, unspecified: Secondary | ICD-10-CM | POA: Diagnosis not present

## 2013-02-02 DIAGNOSIS — Z954 Presence of other heart-valve replacement: Secondary | ICD-10-CM | POA: Diagnosis not present

## 2013-03-08 DIAGNOSIS — Z954 Presence of other heart-valve replacement: Secondary | ICD-10-CM | POA: Diagnosis not present

## 2013-04-20 DIAGNOSIS — I4891 Unspecified atrial fibrillation: Secondary | ICD-10-CM | POA: Diagnosis not present

## 2013-04-20 DIAGNOSIS — Z7901 Long term (current) use of anticoagulants: Secondary | ICD-10-CM | POA: Diagnosis not present

## 2013-04-27 DIAGNOSIS — I059 Rheumatic mitral valve disease, unspecified: Secondary | ICD-10-CM | POA: Diagnosis not present

## 2013-04-27 DIAGNOSIS — Z7901 Long term (current) use of anticoagulants: Secondary | ICD-10-CM | POA: Diagnosis not present

## 2013-05-01 DIAGNOSIS — I059 Rheumatic mitral valve disease, unspecified: Secondary | ICD-10-CM | POA: Diagnosis not present

## 2013-05-10 DIAGNOSIS — Z954 Presence of other heart-valve replacement: Secondary | ICD-10-CM | POA: Diagnosis not present

## 2013-05-21 DIAGNOSIS — Z0000 Encounter for general adult medical examination without abnormal findings: Secondary | ICD-10-CM | POA: Diagnosis not present

## 2013-05-21 DIAGNOSIS — Z79899 Other long term (current) drug therapy: Secondary | ICD-10-CM | POA: Diagnosis not present

## 2013-05-21 DIAGNOSIS — D689 Coagulation defect, unspecified: Secondary | ICD-10-CM | POA: Diagnosis not present

## 2013-05-23 DIAGNOSIS — K8020 Calculus of gallbladder without cholecystitis without obstruction: Secondary | ICD-10-CM | POA: Diagnosis not present

## 2013-05-23 DIAGNOSIS — R1013 Epigastric pain: Secondary | ICD-10-CM | POA: Diagnosis not present

## 2013-06-07 DIAGNOSIS — Z954 Presence of other heart-valve replacement: Secondary | ICD-10-CM | POA: Diagnosis not present

## 2013-06-21 DIAGNOSIS — E785 Hyperlipidemia, unspecified: Secondary | ICD-10-CM | POA: Diagnosis not present

## 2013-06-21 DIAGNOSIS — Z79899 Other long term (current) drug therapy: Secondary | ICD-10-CM | POA: Diagnosis not present

## 2013-06-21 DIAGNOSIS — I2510 Atherosclerotic heart disease of native coronary artery without angina pectoris: Secondary | ICD-10-CM | POA: Diagnosis not present

## 2013-07-05 DIAGNOSIS — Z954 Presence of other heart-valve replacement: Secondary | ICD-10-CM | POA: Diagnosis not present

## 2013-07-18 DIAGNOSIS — Z79899 Other long term (current) drug therapy: Secondary | ICD-10-CM | POA: Diagnosis not present

## 2013-07-18 DIAGNOSIS — E785 Hyperlipidemia, unspecified: Secondary | ICD-10-CM | POA: Diagnosis not present

## 2013-07-18 DIAGNOSIS — I2510 Atherosclerotic heart disease of native coronary artery without angina pectoris: Secondary | ICD-10-CM | POA: Diagnosis not present

## 2013-07-23 DIAGNOSIS — D696 Thrombocytopenia, unspecified: Secondary | ICD-10-CM | POA: Diagnosis not present

## 2013-07-23 DIAGNOSIS — IMO0002 Reserved for concepts with insufficient information to code with codable children: Secondary | ICD-10-CM | POA: Diagnosis not present

## 2013-07-23 DIAGNOSIS — M25519 Pain in unspecified shoulder: Secondary | ICD-10-CM | POA: Diagnosis not present

## 2013-07-27 DIAGNOSIS — M6688 Spontaneous rupture of other tendons, other: Secondary | ICD-10-CM | POA: Diagnosis not present

## 2013-07-27 DIAGNOSIS — S46919A Strain of unspecified muscle, fascia and tendon at shoulder and upper arm level, unspecified arm, initial encounter: Secondary | ICD-10-CM | POA: Diagnosis not present

## 2013-07-27 DIAGNOSIS — S46819A Strain of other muscles, fascia and tendons at shoulder and upper arm level, unspecified arm, initial encounter: Secondary | ICD-10-CM | POA: Diagnosis not present

## 2013-07-27 DIAGNOSIS — M25519 Pain in unspecified shoulder: Secondary | ICD-10-CM | POA: Diagnosis not present

## 2013-07-31 DIAGNOSIS — R1013 Epigastric pain: Secondary | ICD-10-CM | POA: Diagnosis not present

## 2013-08-02 DIAGNOSIS — Z954 Presence of other heart-valve replacement: Secondary | ICD-10-CM | POA: Diagnosis not present

## 2013-08-20 DIAGNOSIS — Z7901 Long term (current) use of anticoagulants: Secondary | ICD-10-CM | POA: Diagnosis not present

## 2013-08-20 DIAGNOSIS — I4891 Unspecified atrial fibrillation: Secondary | ICD-10-CM | POA: Diagnosis not present

## 2013-08-23 DIAGNOSIS — Z0000 Encounter for general adult medical examination without abnormal findings: Secondary | ICD-10-CM | POA: Diagnosis not present

## 2013-08-23 DIAGNOSIS — I6529 Occlusion and stenosis of unspecified carotid artery: Secondary | ICD-10-CM | POA: Diagnosis not present

## 2013-08-23 DIAGNOSIS — Z7901 Long term (current) use of anticoagulants: Secondary | ICD-10-CM | POA: Diagnosis not present

## 2013-08-23 DIAGNOSIS — I2510 Atherosclerotic heart disease of native coronary artery without angina pectoris: Secondary | ICD-10-CM | POA: Diagnosis not present

## 2013-08-23 DIAGNOSIS — I4891 Unspecified atrial fibrillation: Secondary | ICD-10-CM | POA: Diagnosis not present

## 2013-08-30 DIAGNOSIS — I059 Rheumatic mitral valve disease, unspecified: Secondary | ICD-10-CM | POA: Diagnosis not present

## 2013-08-30 DIAGNOSIS — Z7901 Long term (current) use of anticoagulants: Secondary | ICD-10-CM | POA: Diagnosis not present

## 2013-09-06 DIAGNOSIS — I4891 Unspecified atrial fibrillation: Secondary | ICD-10-CM | POA: Diagnosis not present

## 2013-09-06 DIAGNOSIS — Z954 Presence of other heart-valve replacement: Secondary | ICD-10-CM | POA: Diagnosis not present

## 2013-09-06 DIAGNOSIS — Z7901 Long term (current) use of anticoagulants: Secondary | ICD-10-CM | POA: Diagnosis not present

## 2013-09-14 DIAGNOSIS — I059 Rheumatic mitral valve disease, unspecified: Secondary | ICD-10-CM | POA: Diagnosis not present

## 2013-09-14 DIAGNOSIS — Z7901 Long term (current) use of anticoagulants: Secondary | ICD-10-CM | POA: Diagnosis not present

## 2013-09-18 DIAGNOSIS — Z954 Presence of other heart-valve replacement: Secondary | ICD-10-CM | POA: Diagnosis not present

## 2013-09-18 DIAGNOSIS — Z7901 Long term (current) use of anticoagulants: Secondary | ICD-10-CM | POA: Diagnosis not present

## 2013-09-25 DIAGNOSIS — Z954 Presence of other heart-valve replacement: Secondary | ICD-10-CM | POA: Diagnosis not present

## 2013-09-25 DIAGNOSIS — Z7901 Long term (current) use of anticoagulants: Secondary | ICD-10-CM | POA: Diagnosis not present

## 2013-10-09 DIAGNOSIS — Z954 Presence of other heart-valve replacement: Secondary | ICD-10-CM | POA: Diagnosis not present

## 2013-10-22 DIAGNOSIS — E785 Hyperlipidemia, unspecified: Secondary | ICD-10-CM | POA: Diagnosis not present

## 2013-10-22 DIAGNOSIS — Z7901 Long term (current) use of anticoagulants: Secondary | ICD-10-CM | POA: Diagnosis not present

## 2013-10-22 DIAGNOSIS — I4891 Unspecified atrial fibrillation: Secondary | ICD-10-CM | POA: Diagnosis not present

## 2013-10-22 DIAGNOSIS — I2510 Atherosclerotic heart disease of native coronary artery without angina pectoris: Secondary | ICD-10-CM | POA: Diagnosis not present

## 2013-11-16 DIAGNOSIS — Z954 Presence of other heart-valve replacement: Secondary | ICD-10-CM | POA: Diagnosis not present

## 2013-11-27 DIAGNOSIS — R972 Elevated prostate specific antigen [PSA]: Secondary | ICD-10-CM | POA: Diagnosis not present

## 2013-12-13 DIAGNOSIS — Z954 Presence of other heart-valve replacement: Secondary | ICD-10-CM | POA: Diagnosis not present

## 2013-12-17 DIAGNOSIS — I2510 Atherosclerotic heart disease of native coronary artery without angina pectoris: Secondary | ICD-10-CM | POA: Diagnosis not present

## 2013-12-17 DIAGNOSIS — E785 Hyperlipidemia, unspecified: Secondary | ICD-10-CM | POA: Diagnosis not present

## 2013-12-17 DIAGNOSIS — Z7901 Long term (current) use of anticoagulants: Secondary | ICD-10-CM | POA: Diagnosis not present

## 2013-12-17 DIAGNOSIS — I059 Rheumatic mitral valve disease, unspecified: Secondary | ICD-10-CM | POA: Diagnosis not present

## 2014-01-24 DIAGNOSIS — Z954 Presence of other heart-valve replacement: Secondary | ICD-10-CM | POA: Diagnosis not present

## 2014-02-05 DIAGNOSIS — I059 Rheumatic mitral valve disease, unspecified: Secondary | ICD-10-CM | POA: Diagnosis not present

## 2014-02-05 DIAGNOSIS — I4891 Unspecified atrial fibrillation: Secondary | ICD-10-CM | POA: Diagnosis not present

## 2014-02-05 DIAGNOSIS — Z7901 Long term (current) use of anticoagulants: Secondary | ICD-10-CM | POA: Diagnosis not present

## 2014-02-12 DIAGNOSIS — I4891 Unspecified atrial fibrillation: Secondary | ICD-10-CM | POA: Diagnosis not present

## 2014-02-12 DIAGNOSIS — Z7901 Long term (current) use of anticoagulants: Secondary | ICD-10-CM | POA: Diagnosis not present

## 2014-02-15 DIAGNOSIS — Z23 Encounter for immunization: Secondary | ICD-10-CM | POA: Diagnosis not present

## 2014-02-21 DIAGNOSIS — Z952 Presence of prosthetic heart valve: Secondary | ICD-10-CM | POA: Diagnosis not present

## 2014-03-11 ENCOUNTER — Telehealth: Payer: Self-pay | Admitting: *Deleted

## 2014-03-11 NOTE — Telephone Encounter (Signed)
Spoke with patient scheduled new patient's appointment for Wednesday 03/13/14 at 1 pm with Dr Pearlean BrownieSethi.

## 2014-03-13 ENCOUNTER — Ambulatory Visit (INDEPENDENT_AMBULATORY_CARE_PROVIDER_SITE_OTHER): Payer: Medicare Other | Admitting: Neurology

## 2014-03-13 ENCOUNTER — Encounter: Payer: Self-pay | Admitting: Neurology

## 2014-03-13 VITALS — BP 180/78 | HR 59 | Ht 65.0 in | Wt 146.4 lb

## 2014-03-13 DIAGNOSIS — G3184 Mild cognitive impairment, so stated: Secondary | ICD-10-CM | POA: Diagnosis not present

## 2014-03-13 DIAGNOSIS — F0390 Unspecified dementia without behavioral disturbance: Secondary | ICD-10-CM | POA: Insufficient documentation

## 2014-03-13 DIAGNOSIS — R413 Other amnesia: Secondary | ICD-10-CM

## 2014-03-13 MED ORDER — CEREFOLIN NAC 6-2-600 MG PO TABS: 1.0000 | ORAL_TABLET | ORAL | Status: DC

## 2014-03-13 NOTE — Patient Instructions (Addendum)
I had a long discussion with the patient and his wife regarding his memory and cognitive that he is, discuss my examination findings, differential diagnosis, plan for evaluation, treatment and answered questions. I recommend he start taking Cerefolin NAC 1 tablet daily  And resveratrol 200 mg dailyas well as participating mentally challenging activities like doing crossword puzzles, sudoku and playing bridge. Check lab work for treatable causes of memory loss, EEG and MRI scan of the brain. Continue warfarin for his mechanical heart valve. Return for followup in 6 weeks and may consider Aricept at that time if not better.  Mild Neurocognitive Disorder Mild neurocognitive disorder (formerly known as mild cognitive impairment) is a mental disorder. It is a slight abnormal decrease in mental function. The areas of mental function affected may include memory, thought, communication, behavior, and completion of tasks. The decrease is noticeable and measurable but for the most part does not interfere with your daily activities. Mild neurocognitive disorder typically occurs in people older than 60 years but can occur earlier. It is not as serious as major neurocognitive disorder (formerly known as dementia) but may lead to a more serious neurocognitive disorder. However, in some cases the condition does not get worse. A few people with this disorder even improve. CAUSES  There are a number of different causes of mild neurocognitive disorder:   Brain disorders associated with abnormal protein deposits, such as Alzheimer's disease, Pick's disease, and Lewy body disease.  Brain disorders associated with abnormal movement, such as Parkinson's disease and Huntington's disease.  Diseases affecting blood vessels in the brain and resulting in mini-strokes.  Certain infections, such as human immunodeficiency virus (HIV) infection.  Traumatic brain injury.  Other medical conditions such as brain tumors, underactive  thyroid (hypothyroidism), and vitamin B12 deficiency.  Use of certain prescription medicine and "recreational" drugs. SYMPTOMS  Symptoms of mild neurocognitive disorder include:  Difficulty remembering. You may forget details of recent events, names, or phone numbers. You may forget important social events and appointments or repeatedly forget where you put your car keys.  Difficulty thinking and solving problems. You may have trouble with complex tasks such as paying bills or driving in unfamiliar locations.  Difficulty communicating. You may have trouble finding the right word, naming an object, forming a sentence that makes sense, or understanding what you read or hear.  Changes in your behavior or personality. You may lose interest in the things that you used to enjoy or withdraw from social situations. You may get angry more easily than usual. You may act before thinking. You may do things in public that you would not usually do. You may hear or see things that are not real (hallucinations). You may believe falsely that others are trying to hurt you (paranoia). DIAGNOSIS Mild neurocognitive disorder is diagnosed through an assessment by your health care provider. Your health care provider will ask you and your family, friends, or coworkers questions about your symptoms. He or she will ask how often the symptoms occur, how long they have been occurring, whether they are getting worse, and the effect they are having on your life. Your health care provider may refer you to a neurologist or mental health specialist for a detailed evaluation of your mental functions (neuropsychological testing).  To identify the cause of your mild neurocognitive disorder, your health care provider may:  Obtain a detailed medical history.  Ask about alcohol and drug use, including prescription medicine.  Perform a physical exam.  Order blood tests and brain  imaging exams. TREATMENT  Mild neurocognitive  disorder caused by infections, use of certain medicines or "recreational" drugs, and certain medical conditions may improve with treatment of the condition that is causing the disorder. Mild neurocognitive disorder resulting from other causes generally does not improve and may worsen. In these cases, the goal of treatment is to slow progression of the disorder and help you cope with the loss of mental function. Treatments in these cases include:   Medicine. Medicine helps mainly with memory loss and behavioral symptoms.   Talk therapy. Talk therapy provides education, emotional support, memory aids, and other ways of making up for decreases in mental function.   Lifestyle changes. These include regular exercise, a healthy diet (including essential omega-3 fatty acids), intellectual stimulation, and increased social interaction. Document Released: 01/03/2013 Document Revised: 09/17/2013 Document Reviewed: 01/03/2013 [facility] Patient Information 2015 [employer] [place]. This information is not intended to replace advice given to you by your health care provider. Make sure you discuss any questions you have with your health care provider.

## 2014-03-13 NOTE — Progress Notes (Signed)
[facility] [address] [place]. [place] [id number] [phone]       OFFICE CONSULT NOTE  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 The family has noticed progressive memory loss and mild cognitive difficulties over the last 1 year stroke. He has been increasingly forgetful in locking the house or turning of the lights when he goes out. He  also has trouble remembering names of people but he can remember this after a while. He does also have mild anxiety which seems to have increased recently but this is not any medications for that.He has bilateral hearing loss since last 5 years following complications of gentamicin usage for treatment for mitral valve endocarditis with prolonged antibiotics 5 years ago. He underwent mitral valve replacement at that time and has been on warfarin since then with a relatively stable INR. He denies any history of strokes, TIAs, speech problems, gait or balance problems. There have been no significant behavioral issues, hallucinations, delusions, significant headache noted. His gait and balance seem fine and there is no fall or safety risks identified. There is no prior history of seizures, significant head injury or loss of consciousness. Is no family history of dementia. He has not had any evaluation for treatable causes of memory loss or brain imaging done recently.he remains independent with activities of daily living and is able to take care of his own needs.  ROS:   14 system review of systems is positive for hearing loss, ringing in the ears, snoring, urination problems, memory loss, anxiety, snoring  PMH:  Past Medical History  Diagnosis Date  . Coronary artery disease   . Hypertension   . Hyperlipemia   . Arthritis     Social History:  History    Social History  . Marital Status: Married    Spouse Name: N/A    Number of Children: 3  . Years of Education: MD   Occupational History  . Not on file.   Social History Main Topics  . Smoking status: Never Smoker   . Smokeless tobacco: Never Used  . Alcohol Use: Yes     Comment: occ  . Drug Use: No  . Sexual Activity: Not on file   Other Topics Concern  . Not on file   Social History Narrative   Patient is married with 3 children.   Patient is right handed.   Patient is a ProofreaderMedical Doctor.   Patient drinks 2 cups daily.    Medications:   Current Outpatient Prescriptions on File Prior to Visit  Medication Sig Dispense Refill  . atorvastatin (LIPITOR) 80 MG tablet Take 80 mg by mouth daily.      Marland Kitchen. ezetimibe (ZETIA) 10 MG tablet Take 10 mg by mouth daily.      Marland Kitchen. glucosamine-chondroitin 500-400 MG tablet Take 1 tablet by mouth 3 (three) times daily. Takes occassionally      . metoprolol succinate (TOPROL-XL) 25 MG 24 hr tablet Take 25 mg by mouth daily.      Marland Kitchen. warfarin (COUMADIN) 6 MG tablet Take 6 mg by mouth daily.      Marland Kitchen. oxyCODONE-acetaminophen (PERCOCET/ROXICET) 5-325 MG per tablet Take one or 2 tablets every 4-6 hours as needed for postoperative pain  20 tablet  0   No current facility-administered medications on file  prior to visit.    Allergies:   Allergies  Allergen Reactions  . Penicillins     Physical Exam General: well developed, well nourished, elderly [place] seated, in no evident distress Head: head normocephalic and atraumatic.   Neck: supple with no carotid or supraclavicular bruits Cardiovascular: regular rate and rhythm, no murmurs Musculoskeletal: no deformity Skin:  no rash/petichiae Vascular:  Normal pulses all extremities  Neurologic Exam Mental Status: Awake and fully alert. Oriented to place and person only. Recent and remote memory diminished. Attention span, concentration diminished  and fund of knowledge appropriate.  Mood and affect appropriate. MMSE 23/30 with deficits in orientation, attention, calculation and recall. Unable to draw intersecting pentagons. Animal fluency test 15 which is normal. Clock drawing 3/4. Geriatric depression scale 3 which is not depressed. Cranial Nerves: Fundoscopic exam reveals sharp disc margins. Pupils equal, briskly reactive to light. Extraocular movements full without nystagmus. Visual fields full to confrontation. Hearing reduced bilaterallyt. Facial sensation intact. Face, tongue, palate moves normally and symmetrically.  Motor: Normal bulk and tone. Normal strength in all tested extremity muscles. Sensory.: intact to touch , pinprick , position and vibratory sensation.  Coordination: Rapid alternating movements normal in all extremities. Finger-to-nose and heel-to-shin performed accurately bilaterally. Gait and Station: Arises from chair without difficulty. Stance is normal. Gait demonstrates normal stride length and balance . Unable to heel, toe and tandem walk without difficulty.  Reflexes: 1+ and symmetric. Toes downgoing.   NIHSS 2 Modified Rankin  2   ASSESSMENT: [age] year male with one year history of progressive memory and mild cognitive worsening possibly due to mild cognitive impairment versus early dementia. Neurological exam is fairly nonfocal but given history of mitral valve replacement silent cerebrovascular disease is a consideration    PLAN: I had a long discussion with the patient and his wife regarding his memory and cognitive that he is, discuss my examination findings, differential diagnosis, plan for evaluation, treatment and answered questions. I recommend he start taking Cerefolin NAC 1 tablet daily  And resveratrol 200 mg daily as well as participating mentally challenging activities like doing crossword puzzles, sudoku and playing bridge. Check lab work for treatable causes of memory loss, EEG and MRI scan of the brain. Continue warfarin for his  mechanical heart valve. Return for followup in 6 weeks and may consider Aricept at that time if not better.   Note: This document was prepared with digital dictation and possible smart phrase technology. Any transcriptional errors that result from this process are unintentional.

## 2014-03-14 LAB — VITAMIN B12: VITAMIN B 12: 347 pg/mL (ref 211–946)

## 2014-03-14 LAB — CALCIUM: CALCIUM: 9.6 mg/dL (ref 8.6–10.2)

## 2014-03-14 LAB — TSH: TSH: 1.86 u[IU]/mL (ref 0.450–4.500)

## 2014-03-14 LAB — VITAMIN D 1,25 DIHYDROXY: VIT D 1 25 DIHYDROXY: 45.3 pg/mL (ref 19.9–79.3)

## 2014-03-14 LAB — RPR: RPR: NONREACTIVE

## 2014-03-19 ENCOUNTER — Other Ambulatory Visit: Payer: Medicare Other | Admitting: Radiology

## 2014-03-20 ENCOUNTER — Encounter: Payer: Self-pay | Admitting: Neurology

## 2014-03-21 ENCOUNTER — Ambulatory Visit (INDEPENDENT_AMBULATORY_CARE_PROVIDER_SITE_OTHER): Payer: Medicare Other | Admitting: Radiology

## 2014-03-21 DIAGNOSIS — R413 Other amnesia: Secondary | ICD-10-CM | POA: Diagnosis not present

## 2014-03-21 DIAGNOSIS — Z952 Presence of prosthetic heart valve: Secondary | ICD-10-CM | POA: Diagnosis not present

## 2014-03-22 NOTE — Procedures (Signed)
   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  TECHNIQUE:  16 channel EEG was performed based on standard 10-16 international system. One channel was dedicated to EKG, which has demonstrates normal sinus rhythm of 60 beats per minutes.  Upon awakening, the posterior background activity was well-developed, in alpha range, with amplitude of microvoltage, reactive to eye opening and closure.there was frequent bilateral frontal artifact.  There was no evidence of epileptiform discharge.  Photic stimulation was performed, which induced a symmetric photic driving.  Hyperventilation was not performed.  Stage II sleep was achieved.  CONCLUSION: This is a  normal awake and asleep EEG.  There is no electrodiagnostic evidence of epileptiform discharge

## 2014-03-29 ENCOUNTER — Ambulatory Visit
Admission: RE | Admit: 2014-03-29 | Discharge: 2014-03-29 | Disposition: A | Discharge status: Home / Self Care | Payer: Medicare Other | Source: Ambulatory Visit | Attending: Neurology | Admitting: Neurology

## 2014-03-29 DIAGNOSIS — R413 Other amnesia: Secondary | ICD-10-CM

## 2014-03-29 DIAGNOSIS — R4181 Age-related cognitive decline: Secondary | ICD-10-CM | POA: Diagnosis not present

## 2014-03-29 MED ORDER — GADOBENATE DIMEGLUMINE 529 MG/ML IV SOLN
13.0000 mL | Freq: Once | INTRAVENOUS | Status: AC | PRN
  Administered 2014-03-29: 13 mL via INTRAVENOUS

## 2014-04-15 DIAGNOSIS — Z7901 Long term (current) use of anticoagulants: Secondary | ICD-10-CM | POA: Diagnosis not present

## 2014-04-19 DIAGNOSIS — Z952 Presence of prosthetic heart valve: Secondary | ICD-10-CM | POA: Diagnosis not present

## 2014-04-22 DIAGNOSIS — Z7901 Long term (current) use of anticoagulants: Secondary | ICD-10-CM | POA: Diagnosis not present

## 2014-04-24 DIAGNOSIS — Z7901 Long term (current) use of anticoagulants: Secondary | ICD-10-CM | POA: Diagnosis not present

## 2014-05-24 ENCOUNTER — Ambulatory Visit (INDEPENDENT_AMBULATORY_CARE_PROVIDER_SITE_OTHER): Payer: Medicare Other | Admitting: Neurology

## 2014-05-24 ENCOUNTER — Encounter: Payer: Self-pay | Admitting: Neurology

## 2014-05-24 VITALS — Ht 65.0 in | Wt 145.4 lb

## 2014-05-24 DIAGNOSIS — G3184 Mild cognitive impairment, so stated: Secondary | ICD-10-CM

## 2014-05-24 NOTE — Progress Notes (Signed)
[facility] [address] [place]. [place] [address] [phone]       OFFICE FOLLOW UP VISIT NOTE  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 The family has noticed progressive memory loss and mild cognitive difficulties over the last 1 year stroke. He has been increasingly forgetful in locking the house or turning of the lights when he goes out. He  also has trouble remembering names of people but he can remember this after a while. He does also have mild anxiety which seems to have increased recently but this is not any medications for that.He has bilateral hearing loss since last 5 years following complications of gentamicin usage for treatment for mitral valve endocarditis with prolonged antibiotics 5 years ago. He underwent mitral valve replacement at that time and has been on warfarin since then with a relatively stable INR. He denies any history of strokes, TIAs, speech problems, gait or balance problems. There have been no significant behavioral issues, hallucinations, delusions, significant headache noted. His gait and balance seem fine and there is no fall or safety risks identified. There is no prior history of seizures, significant head injury or loss of consciousness. Is no family history of dementia. He has not had any evaluation for treatable causes of memory loss or brain imaging done recently.he remains independent with activities of daily living and is able to take care of his own needs.   Update 05/24/2014 : He returns for follow-up today after his last visit 3 months ago accompanied by his son. He has noticed improvement in his memory as well as concentration and cognitive difficulties. He has been taking Cerefolin and reservetrol every day  . He also participates in cognitively  challenging activities. He recently traveled to [place] for a month and had slight difficulties in coping. He forgot his INR machine. He remains independent with activities of daily living and does not need help with any activities. He has no neuro neurological complaints. He underwent lab work for treatable causes of cognitive impairment which was negative. EEG showed no seizure activity. MRI scan of the brain personally reviewed shows mild changes of chronic microvascular ischemia and microhemorrhages  ROS:   14 system review of systems is positive for eye itching, redness, hearing loss, ringing in the ears, frequent waking, daytime sleepiness, snoring, cold intolerance, frequency of urination, bladder urgency, joint pain, walking difficulty, neck pain, neck stiffness, confusion, agitation, decreased concentration, anxiety and memory loss. PMH:  Past Medical History  Diagnosis Date  . Coronary artery disease   . Hypertension   . Hyperlipemia   . Arthritis     Social History:  History   Social History  . Marital Status: Married    Spouse Name: N/A    Number of Children: 3  . Years of Education: MD   Occupational History  . Not on file.   Social History Main Topics  . Smoking status: Never Smoker   . Smokeless tobacco: Never Used  . Alcohol Use: 0.0 oz/week    0 Not specified per week     Comment: occ  . Drug Use: No  . Sexual Activity: Not on file   Other Topics Concern  . Not on file   Social History Narrative   Patient is married with 3 children.   Patient  is right handed.   Patient is a ProofreaderMedical Doctor.   Patient drinks 2 cups daily.    Medications:   Current Outpatient Prescriptions on File Prior to Visit  Medication Sig Dispense Refill  . allopurinol (ZYLOPRIM) 300 MG tablet Take 300 mg by mouth daily. Taking 1/2 tablet daily    . atorvastatin (LIPITOR) 80 MG tablet Take 80 mg by mouth daily.    Marland Kitchen. ezetimibe (ZETIA) 10 MG tablet Take 10 mg by mouth daily.    Marland Kitchen.  glucosamine-chondroitin 500-400 MG tablet Take 1 tablet by mouth 3 (three) times daily. Takes occassionally    . Methylfol-Methylcob-Acetylcyst (CEREFOLIN NAC) 6-2-600 MG TABS Take 1 tablet by mouth 1 day or 1 dose. 90 each 3  . metoprolol succinate (TOPROL-XL) 25 MG 24 hr tablet Take 25 mg by mouth daily.    Marland Kitchen. warfarin (COUMADIN) 6 MG tablet Take 6 mg by mouth daily.    Marland Kitchen. zolpidem (AMBIEN) 5 MG tablet Take 5 mg by mouth at bedtime as needed for sleep.     No current facility-administered medications on file prior to visit.    Allergies:   Allergies  Allergen Reactions  . Penicillins     Physical Exam General: well developed, well nourished, elderly [place] male seated, in no evident distress Head: head normocephalic and atraumatic.   Neck: supple with no carotid or supraclavicular bruits Cardiovascular: regular rate and rhythm, no murmurs Musculoskeletal: no deformity Skin:  no rash/petichiae Vascular:  Normal pulses all extremities There were no vitals filed for this visit.  Neurologic Exam Mental Status: Awake and fully alert. Oriented to place and person only. Recent and remote memory diminished. Attention span, concentration diminished  and fund of knowledge appropriate. Mood and affect appropriate. MMSE 29/30 with deficits in orientation, attention, calculation and recall. Unable to draw intersecting pentagons. Animal fluency test 17 which is normal. Clock drawing 4/4. Geriatric depression scale 1only which is not depressed. Cranial Nerves: Fundoscopic exam not done . Pupils equal, briskly reactive to light. Extraocular movements full without nystagmus. Visual fields full to confrontation. Hearing reduced bilaterallyt. Facial sensation intact. Face, tongue, palate moves normally and symmetrically.  Motor: Normal bulk and tone. Normal strength in all tested extremity muscles. Sensory.: intact to touch , pinprick , position and vibratory sensation.  Coordination: Rapid  alternating movements normal in all extremities. Finger-to-nose and heel-to-shin performed accurately bilaterally. Gait and Station: Arises from chair without difficulty. Stance is normal. Gait demonstrates normal stride length and balance . Unable to heel, toe and tandem walk without difficulty.  Reflexes: 1+ and symmetric. Toes downgoing.      ASSESSMENT: [age] year male with one year history of progressive memory and mild cognitive worsening possibly due to mild cognitive impairment who has shown some improvement and stabilization.Marland Kitchen.    PLAN: I had a long discussion with the patient and his son regarding his mild cognitive impairment  discussed risk of progression to dementia in the future, prevention strategies and answered questions. I personally reviewed MRI films and discussed lab results and EEG findings with the patient and answered questions Continue Cerefolin and reservetrol   at the present time and participate in mentally challenging activities like solving crossword puzzles, sudoku and playing bridge. Return for follow-up in 6 months or call earlier if necessary. Note: This document was prepared with digital dictation and possible smart phrase technology. Any transcriptional errors that result from this process are unintentional.

## 2014-05-24 NOTE — Patient Instructions (Signed)
I had a long discussion with the patient and his son regarding his mild cognitive impairment  discussed risk of progression to dementia in the future, prevention strategies and answered questions. I personally reviewed MRI films and discussed lab results and EEG findings with the patient and answered questions Continue Cerefolin and reservetrol   at the present time and participate in mentally challenging activities like solving crossword puzzles, sudoku and playing bridge. Return for follow-up in 6 months or call earlier if necessary.

## 2014-05-30 DIAGNOSIS — Z952 Presence of prosthetic heart valve: Secondary | ICD-10-CM | POA: Diagnosis not present

## 2014-06-04 DIAGNOSIS — E785 Hyperlipidemia, unspecified: Secondary | ICD-10-CM | POA: Diagnosis not present

## 2014-06-04 DIAGNOSIS — Z952 Presence of prosthetic heart valve: Secondary | ICD-10-CM | POA: Diagnosis not present

## 2014-06-04 DIAGNOSIS — I2510 Atherosclerotic heart disease of native coronary artery without angina pectoris: Secondary | ICD-10-CM | POA: Diagnosis not present

## 2014-06-18 DIAGNOSIS — Z79899 Other long term (current) drug therapy: Secondary | ICD-10-CM | POA: Diagnosis not present

## 2014-06-18 DIAGNOSIS — I2510 Atherosclerotic heart disease of native coronary artery without angina pectoris: Secondary | ICD-10-CM | POA: Diagnosis not present

## 2014-06-19 DIAGNOSIS — Z6825 Body mass index (BMI) 25.0-25.9, adult: Secondary | ICD-10-CM | POA: Diagnosis not present

## 2014-06-19 DIAGNOSIS — E785 Hyperlipidemia, unspecified: Secondary | ICD-10-CM | POA: Diagnosis not present

## 2014-06-19 DIAGNOSIS — H9193 Unspecified hearing loss, bilateral: Secondary | ICD-10-CM | POA: Diagnosis not present

## 2014-06-19 DIAGNOSIS — Z7901 Long term (current) use of anticoagulants: Secondary | ICD-10-CM | POA: Diagnosis not present

## 2014-06-19 DIAGNOSIS — I10 Essential (primary) hypertension: Secondary | ICD-10-CM | POA: Diagnosis not present

## 2014-06-19 DIAGNOSIS — M1990 Unspecified osteoarthritis, unspecified site: Secondary | ICD-10-CM | POA: Diagnosis not present

## 2014-06-19 DIAGNOSIS — M109 Gout, unspecified: Secondary | ICD-10-CM | POA: Diagnosis not present

## 2014-06-19 DIAGNOSIS — G309 Alzheimer's disease, unspecified: Secondary | ICD-10-CM | POA: Diagnosis not present

## 2014-06-19 DIAGNOSIS — I2510 Atherosclerotic heart disease of native coronary artery without angina pectoris: Secondary | ICD-10-CM | POA: Diagnosis not present

## 2014-06-27 DIAGNOSIS — Z952 Presence of prosthetic heart valve: Secondary | ICD-10-CM | POA: Diagnosis not present

## 2014-07-10 DIAGNOSIS — H26493 Other secondary cataract, bilateral: Secondary | ICD-10-CM | POA: Diagnosis not present

## 2014-07-10 DIAGNOSIS — H43393 Other vitreous opacities, bilateral: Secondary | ICD-10-CM | POA: Diagnosis not present

## 2014-07-17 ENCOUNTER — Telehealth: Payer: Self-pay | Admitting: Neurology

## 2014-07-17 NOTE — Telephone Encounter (Signed)
Pt is calling stating he needs to speak with you for a couple of minutes.  Pt would not give me any more information.  Please call.

## 2014-07-17 NOTE — Telephone Encounter (Signed)
i called and left message on his answering machine to call me

## 2014-07-22 DIAGNOSIS — Z952 Presence of prosthetic heart valve: Secondary | ICD-10-CM | POA: Diagnosis not present

## 2014-07-22 DIAGNOSIS — Z7901 Long term (current) use of anticoagulants: Secondary | ICD-10-CM | POA: Diagnosis not present

## 2014-07-22 DIAGNOSIS — I340 Nonrheumatic mitral (valve) insufficiency: Secondary | ICD-10-CM | POA: Diagnosis not present

## 2014-07-25 DIAGNOSIS — Z952 Presence of prosthetic heart valve: Secondary | ICD-10-CM | POA: Diagnosis not present

## 2014-07-29 NOTE — Telephone Encounter (Signed)
Patient called requesting his records, transferred to medical records.

## 2014-08-07 DIAGNOSIS — I2510 Atherosclerotic heart disease of native coronary artery without angina pectoris: Secondary | ICD-10-CM | POA: Diagnosis not present

## 2014-08-07 DIAGNOSIS — I340 Nonrheumatic mitral (valve) insufficiency: Secondary | ICD-10-CM | POA: Diagnosis not present

## 2014-08-23 DIAGNOSIS — I2510 Atherosclerotic heart disease of native coronary artery without angina pectoris: Secondary | ICD-10-CM | POA: Diagnosis not present

## 2014-08-23 DIAGNOSIS — Z7901 Long term (current) use of anticoagulants: Secondary | ICD-10-CM | POA: Diagnosis not present

## 2014-08-26 DIAGNOSIS — I2510 Atherosclerotic heart disease of native coronary artery without angina pectoris: Secondary | ICD-10-CM | POA: Diagnosis not present

## 2014-08-26 DIAGNOSIS — Z7901 Long term (current) use of anticoagulants: Secondary | ICD-10-CM | POA: Diagnosis not present

## 2014-08-29 DIAGNOSIS — Z952 Presence of prosthetic heart valve: Secondary | ICD-10-CM | POA: Diagnosis not present

## 2014-09-04 DIAGNOSIS — Z9181 History of falling: Secondary | ICD-10-CM | POA: Diagnosis not present

## 2014-09-04 DIAGNOSIS — Z7901 Long term (current) use of anticoagulants: Secondary | ICD-10-CM | POA: Diagnosis not present

## 2014-09-04 DIAGNOSIS — Z125 Encounter for screening for malignant neoplasm of prostate: Secondary | ICD-10-CM | POA: Diagnosis not present

## 2014-09-04 DIAGNOSIS — Z1389 Encounter for screening for other disorder: Secondary | ICD-10-CM | POA: Diagnosis not present

## 2014-09-04 DIAGNOSIS — Z23 Encounter for immunization: Secondary | ICD-10-CM | POA: Diagnosis not present

## 2014-09-04 DIAGNOSIS — Z6823 Body mass index (BMI) 23.0-23.9, adult: Secondary | ICD-10-CM | POA: Diagnosis not present

## 2014-09-04 DIAGNOSIS — Z0000 Encounter for general adult medical examination without abnormal findings: Secondary | ICD-10-CM | POA: Diagnosis not present

## 2014-09-04 DIAGNOSIS — I10 Essential (primary) hypertension: Secondary | ICD-10-CM | POA: Diagnosis not present

## 2014-09-04 DIAGNOSIS — E785 Hyperlipidemia, unspecified: Secondary | ICD-10-CM | POA: Diagnosis not present

## 2014-09-04 DIAGNOSIS — R413 Other amnesia: Secondary | ICD-10-CM | POA: Diagnosis not present

## 2014-09-10 DIAGNOSIS — M1712 Unilateral primary osteoarthritis, left knee: Secondary | ICD-10-CM | POA: Diagnosis not present

## 2014-09-12 DIAGNOSIS — Z951 Presence of aortocoronary bypass graft: Secondary | ICD-10-CM | POA: Diagnosis not present

## 2014-09-12 DIAGNOSIS — H905 Unspecified sensorineural hearing loss: Secondary | ICD-10-CM | POA: Diagnosis not present

## 2014-09-12 DIAGNOSIS — Z974 Presence of external hearing-aid: Secondary | ICD-10-CM | POA: Diagnosis not present

## 2014-09-12 DIAGNOSIS — Z880 Allergy status to penicillin: Secondary | ICD-10-CM | POA: Diagnosis not present

## 2014-09-12 DIAGNOSIS — H903 Sensorineural hearing loss, bilateral: Secondary | ICD-10-CM | POA: Diagnosis not present

## 2014-09-12 DIAGNOSIS — H9319 Tinnitus, unspecified ear: Secondary | ICD-10-CM | POA: Diagnosis not present

## 2014-09-12 DIAGNOSIS — Z79899 Other long term (current) drug therapy: Secondary | ICD-10-CM | POA: Diagnosis not present

## 2014-09-23 DIAGNOSIS — M533 Sacrococcygeal disorders, not elsewhere classified: Secondary | ICD-10-CM | POA: Diagnosis not present

## 2014-09-26 DIAGNOSIS — Z7901 Long term (current) use of anticoagulants: Secondary | ICD-10-CM | POA: Diagnosis not present

## 2014-09-26 DIAGNOSIS — Z954 Presence of other heart-valve replacement: Secondary | ICD-10-CM | POA: Diagnosis not present

## 2014-10-02 DIAGNOSIS — Z7901 Long term (current) use of anticoagulants: Secondary | ICD-10-CM | POA: Diagnosis not present

## 2014-10-02 DIAGNOSIS — Z952 Presence of prosthetic heart valve: Secondary | ICD-10-CM | POA: Diagnosis not present

## 2014-10-03 DIAGNOSIS — Z952 Presence of prosthetic heart valve: Secondary | ICD-10-CM | POA: Diagnosis not present

## 2014-10-07 DIAGNOSIS — Z9889 Other specified postprocedural states: Secondary | ICD-10-CM | POA: Diagnosis not present

## 2014-10-07 DIAGNOSIS — Z7901 Long term (current) use of anticoagulants: Secondary | ICD-10-CM | POA: Diagnosis not present

## 2014-10-08 DIAGNOSIS — Z7901 Long term (current) use of anticoagulants: Secondary | ICD-10-CM | POA: Insufficient documentation

## 2014-10-15 DIAGNOSIS — Z7901 Long term (current) use of anticoagulants: Secondary | ICD-10-CM | POA: Diagnosis not present

## 2014-10-17 DIAGNOSIS — Z79899 Other long term (current) drug therapy: Secondary | ICD-10-CM | POA: Diagnosis not present

## 2014-10-17 DIAGNOSIS — Z681 Body mass index (BMI) 19 or less, adult: Secondary | ICD-10-CM | POA: Diagnosis not present

## 2014-10-17 DIAGNOSIS — K219 Gastro-esophageal reflux disease without esophagitis: Secondary | ICD-10-CM | POA: Diagnosis not present

## 2014-10-17 DIAGNOSIS — H9190 Unspecified hearing loss, unspecified ear: Secondary | ICD-10-CM | POA: Diagnosis not present

## 2014-10-17 DIAGNOSIS — I2510 Atherosclerotic heart disease of native coronary artery without angina pectoris: Secondary | ICD-10-CM | POA: Diagnosis not present

## 2014-10-17 DIAGNOSIS — G309 Alzheimer's disease, unspecified: Secondary | ICD-10-CM | POA: Diagnosis not present

## 2014-10-17 DIAGNOSIS — E785 Hyperlipidemia, unspecified: Secondary | ICD-10-CM | POA: Diagnosis not present

## 2014-10-17 DIAGNOSIS — M109 Gout, unspecified: Secondary | ICD-10-CM | POA: Diagnosis not present

## 2014-10-17 DIAGNOSIS — M1990 Unspecified osteoarthritis, unspecified site: Secondary | ICD-10-CM | POA: Diagnosis not present

## 2014-10-25 DIAGNOSIS — Z954 Presence of other heart-valve replacement: Secondary | ICD-10-CM | POA: Diagnosis not present

## 2014-10-25 DIAGNOSIS — Z7901 Long term (current) use of anticoagulants: Secondary | ICD-10-CM | POA: Diagnosis not present

## 2014-11-01 DIAGNOSIS — Z952 Presence of prosthetic heart valve: Secondary | ICD-10-CM | POA: Diagnosis not present

## 2014-11-04 DIAGNOSIS — N3090 Cystitis, unspecified without hematuria: Secondary | ICD-10-CM | POA: Diagnosis not present

## 2014-11-11 ENCOUNTER — Other Ambulatory Visit: Payer: Self-pay

## 2014-11-22 ENCOUNTER — Ambulatory Visit (INDEPENDENT_AMBULATORY_CARE_PROVIDER_SITE_OTHER): Payer: Medicare Other | Admitting: Neurology

## 2014-11-22 ENCOUNTER — Encounter: Payer: Self-pay | Admitting: Neurology

## 2014-11-22 VITALS — BP 138/69 | HR 53 | Ht 65.0 in | Wt 141.0 lb

## 2014-11-22 DIAGNOSIS — F0390 Unspecified dementia without behavioral disturbance: Secondary | ICD-10-CM

## 2014-11-22 MED ORDER — MEMANTINE HCL 28 X 5 MG & 21 X 10 MG PO TABS: ORAL_TABLET | ORAL | Status: DC

## 2014-11-22 NOTE — Progress Notes (Signed)
[facility] [address] [place]. [place] [address] [phone]       OFFICE FOLLOW UP VISIT NOTE  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 The family has noticed progressive memory loss and mild cognitive difficulties over the last 1 year stroke. He has been increasingly forgetful in locking the house or turning of the lights when he goes out. He  also has trouble remembering names of people but he can remember this after a while. He does also have mild anxiety which seems to have increased recently but this is not any medications for that.He has bilateral hearing loss since last 5 years following complications of gentamicin usage for treatment for mitral valve endocarditis with prolonged antibiotics 5 years ago. He underwent mitral valve replacement at that time and has been on warfarin since then with a relatively stable INR. He denies any history of strokes, TIAs, speech problems, gait or balance problems. There have been no significant behavioral issues, hallucinations, delusions, significant headache noted. His gait and balance seem fine and there is no fall or safety risks identified. There is no prior history of seizures, significant head injury or loss of consciousness. Is no family history of dementia. He has not had any evaluation for treatable causes of memory loss or brain imaging done recently.he remains independent with activities of daily living and is able to take care of his own needs.   Update 05/24/2014 : He returns for follow-up today after his last visit 3 months ago accompanied by his son. He has noticed improvement in his memory as well as concentration and cognitive difficulties. He has been taking Cerefolin and reservetrol every day  . He also participates in cognitively  challenging activities. He recently traveled to [place] for a month and had slight difficulties in coping. He forgot his INR machine. He remains independent with activities of daily living and does not need help with any activities. He has no neuro neurological complaints. He underwent lab work for treatable causes of cognitive impairment which was negative. EEG showed no seizure activity. MRI scan of the brain personally reviewed shows mild changes of chronic microvascular ischemia and microhemorrhages  Update 11/22/2014 : He returns for follow-up after last visit 6 months ago. Is accompanied by his wife and son who contributed to the history. They have  noticed cognitive decline since the last visit  With the patient having good days and bad days and being confused and disoriented particularly when he has to learn some new activity in task. At times he is had trouble recognizing familiar faces on one occasion he had trouble coming up with his daughter's name. He was seen by his primary physician recently who gave him a sample pack of Namenda to try but he has not started that yet. He denies any fall, injury, focal symptoms suggestive of a stroke in the form of speech difficulties extremity weakness numbness gait or balance problems. He remains on warfarin which is tolerating well with INR being fairly stable. ROS:   14 system review of systems is positive for  hearing loss, ringing in the ears, frequent waking, daytime sleepiness, snoring, , confusion, agitation, decreased concentration, anxiety and memory loss. And all other systems negative PMH:  Past Medical History  Diagnosis Date  . Coronary artery disease   .  Hypertension   . Hyperlipemia   . Arthritis     Social History:  History   Social History  . Marital Status: Married    Spouse Name: N/A  . Number of Children: 3  . Years of Education: MD   Occupational History  . Not on file.   Social History Main Topics  . Smoking status: Never  Smoker   . Smokeless tobacco: Never Used  . Alcohol Use: 0.0 oz/week    0 Standard drinks or equivalent per week     Comment: occ  . Drug Use: No  . Sexual Activity: Not on file   Other Topics Concern  . Not on file   Social History Narrative   Patient is married with 3 children.   Patient is right handed.   Patient is a ProofreaderMedical Doctor.   Patient drinks 2 cups daily.    Medications:   Current Outpatient Prescriptions on File Prior to Visit  Medication Sig Dispense Refill  . allopurinol (ZYLOPRIM) 300 MG tablet Take 300 mg by mouth daily. Taking 1/2 tablet daily    . atorvastatin (LIPITOR) 80 MG tablet Take 80 mg by mouth daily.    Marland Kitchen. ezetimibe (ZETIA) 10 MG tablet Take 10 mg by mouth daily.    . Methylfol-Methylcob-Acetylcyst (CEREFOLIN NAC) 6-2-600 MG TABS Take 1 tablet by mouth 1 day or 1 dose. 90 each 3  . warfarin (COUMADIN) 6 MG tablet Take 6 mg by mouth daily.    Marland Kitchen. zolpidem (AMBIEN) 5 MG tablet Take 5 mg by mouth at bedtime as needed for sleep.    Marland Kitchen. glucosamine-chondroitin 500-400 MG tablet Take 1 tablet by mouth 3 (three) times daily. Takes occassionally    . metoprolol succinate (TOPROL-XL) 25 MG 24 hr tablet Take 25 mg by mouth daily.     No current facility-administered medications on file prior to visit.    Allergies:   Allergies  Allergen Reactions  . Penicillins     Physical Exam General: well developed, well nourished, elderly [place] male seated, in no evident distress Head: head normocephalic and atraumatic.   Neck: supple with no carotid or supraclavicular bruits Cardiovascular: regular rate and rhythm, no murmurs Musculoskeletal: no deformity Skin:  no rash/petichiae Vascular:  Normal pulses all extremities Filed Vitals:   11/22/14 0919  BP: 138/69  Pulse: 53    Neurologic Exam Mental Status: Awake and fully alert. Oriented to place and person only. Recent and remote memory diminished. Attention span, concentration diminished  and fund  of knowledge appropriate. Mood and affect appropriate. MMSE 22/30 with deficits in orientation, attention, calculation and recall. Unable to draw intersecting pentagons. Animal fluency test 13 which is normal. Clock drawing 2/4. Geriatric depression scale 1only which is not depressed. Cranial Nerves: Fundoscopic exam not done . Pupils equal, briskly reactive to light. Extraocular movements full without nystagmus. Visual fields full to confrontation. Hearing reduced bilaterallyt. Facial sensation intact. Face, tongue, palate moves normally and symmetrically.  Motor: Normal bulk and tone. Normal strength in all tested extremity muscles. Sensory.: intact to touch , pinprick , position and vibratory sensation.  Coordination: Rapid alternating movements normal in all extremities. Finger-to-nose and heel-to-shin performed accurately bilaterally. Gait and Station: Arises from chair without difficulty. Stance is normal. Gait demonstrates normal stride length and balance . Unable to heel, toe and tandem walk without difficulty.  Reflexes: 1+ and symmetric. Toes downgoing.      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 PLAN:  I had a long discussion with the patient, wife and son regarding his significant cognitive decline over the last 6 months which likely suggests dementia of mixed vascular and Alzheimer's type. I recommend trial of Namenda etc. our starter pack and if tolerated continue it. Check CT scan of the head as well as lab work to look for reversible etiologies. Greater than 50% of this 25 minute visit was spent in counseling and coordination of care Return for follow-up in 2 months or call earlier if necessary Delia Heady, MD Note: This document was prepared with digital dictation and possible smart phrase technology. Any transcriptional errors that result from this process are  unintentional.

## 2014-11-22 NOTE — Patient Instructions (Addendum)
I had a long discussion with the patient, wife and son regarding his significant cognitive decline over the last 6 months which likely suggests dementia of mixed vascular and Alzheimer's type. I recommend trial of Namenda etc. our starter pack and if tolerated continue it. Check CT scan of the head as well as lab work to look for reversible etiologies. Greater than 50% of this 25 minute visit was spent in counseling and coordination of care Return for follow-up in 2 months or call earlier if necessary Dementia Dementia is a general term for problems with brain function. A person with dementia has memory loss and a hard time with at least one other brain function such as thinking, speaking, or problem solving. Dementia can affect social functioning, how you do your job, your mood, or your personality. The changes may be hidden for a long time. The earliest forms of this disease are usually not detected by family or friends. Dementia can be:  Irreversible.  Potentially reversible.  Partially reversible.  Progressive. This means it can get worse over time. CAUSES  Irreversible dementia causes may include:  Degeneration of brain cells (Alzheimer disease or Lewy body dementia).  Multiple small strokes (vascular dementia).  Infection (chronic meningitis or Creutzfeldt-Jakob disease).  Frontotemporal dementia. This affects younger people, age 18 to 59, compared to those who have Alzheimer disease.  Dementia associated with other disorders like Parkinson disease, Huntington disease, or HIV-associated dementia. Potentially or partially reversible dementia causes may include:  Medicines.  Metabolic causes such as excessive alcohol intake, vitamin B12 deficiency, or thyroid disease.  Masses or pressure in the brain such as a tumor, blood clot, or hydrocephalus. SIGNS AND SYMPTOMS  Symptoms are often hard to detect. Family members or coworkers may not notice them early in the disease process.  Different people with dementia may have different symptoms. Symptoms can include:  A hard time with memory, especially recent memory. Long-term memory may not be impaired.  Asking the same question multiple times or forgetting something someone just said.  A hard time speaking your thoughts or finding certain words.  A hard time solving problems or performing familiar tasks (such as how to use a telephone).  Sudden changes in mood.  Changes in personality, especially increasing moodiness or mistrust.  Depression.  A hard time understanding complex ideas that were never a problem in the past. DIAGNOSIS  There are no specific tests for dementia.   Your health care provider may recommend a thorough evaluation. This is because some forms of dementia can be reversible. The evaluation will likely include a physical exam and getting a detailed history from you and a family member. The history often gives the best clues and suggestions for a diagnosis.  Memory testing may be done. A detailed brain function evaluation called neuropsychologic testing may be helpful.  Lab tests and brain imaging (such as a CT scan or MRI scan) are sometimes important.  Sometimes observation and re-evaluation over time is very helpful. TREATMENT  Treatment depends on the cause.   If the problem is a vitamin deficiency, it may be helped or cured with supplements.  For dementias such as Alzheimer disease, medicines are available to stabilize or slow the course of the disease. There are no cures for this type of dementia.  Your health care provider can help direct you to groups, organizations, and other health care providers to help with decisions in the care of you or your loved one. HOME CARE INSTRUCTIONS The care of  individuals with dementia is varied and dependent upon the progression of the dementia. The following suggestions are intended for the person living with, or caring for, the person with  dementia.  Create a safe environment.  Remove the locks on bathroom doors to prevent the person from accidentally locking himself or herself in.  Use childproof latches on kitchen cabinets and any place where cleaning supplies, chemicals, or alcohol are kept.  Use childproof covers in unused electrical outlets.  Install childproof devices to keep doors and windows secured.  Remove stove knobs or install safety knobs and an automatic shut-off on the stove.  Lower the temperature on water heaters.  Label medicines and keep them locked up.  Secure knives, lighters, matches, power tools, and guns, and keep these items out of reach.  Keep the house free from clutter. Remove rugs or anything that might contribute to a fall.  Remove objects that might break and hurt the person.  Make sure lighting is good, both inside and outside.  Install grab rails as needed.  Use a monitoring device to alert you to falls or other needs for help.  Reduce confusion.  Keep familiar objects and people around.  Use night lights or dim lights at night.  Label items or areas.  Use reminders, notes, or directions for daily activities or tasks.  Keep a simple, consistent routine for waking, meals, bathing, dressing, and bedtime.  Create a calm, quiet environment.  Place large clocks and calendars prominently.  Display emergency numbers and home address near all telephones.  Use cues to establish different times of the day. An example is to open curtains to let the natural light in during the day.   Use effective communication.  Choose simple words and short sentences.  Use a gentle, calm tone of voice.  Be careful not to interrupt.  If the person is struggling to find a word or communicate a thought, try to provide the word or thought.  Ask one question at a time. Allow the person ample time to answer questions. Repeat the question again if the person does not respond.  Reduce  nighttime restlessness.  Provide a comfortable bed.  Have a consistent nighttime routine.  Ensure a regular walking or physical activity schedule. Involve the person in daily activities as much as possible.  Limit napping during the day.  Limit caffeine.  Attend social events that stimulate rather than overwhelm the senses.  Encourage good nutrition and hydration.  Reduce distractions during meal times and snacks.  Avoid foods that are too hot or too cold.  Monitor chewing and swallowing ability.  Continue with routine vision, hearing, dental, and medical screenings.  Give medicines only as directed by the health care provider.  Monitor driving abilities. Do not allow the person to drive when safe driving is no longer possible.  Register with an identification program which could provide location assistance in the event of a missing person situation. SEEK MEDICAL CARE IF:   New behavioral problems start such as moodiness, aggressiveness, or seeing things that are not there (hallucinations).  Any new problem with brain function happens. This includes problems with balance, speech, or falling a lot.  Problems with swallowing develop.  Any symptoms of other illness happen. Small changes or worsening in any aspect of brain function can be a sign that the illness is getting worse. It can also be a sign of another medical illness such as infection. Seeing a health care provider right away is important. SEEK  IMMEDIATE MEDICAL CARE IF:   A fever develops.  New or worsened confusion develops.  New or worsened sleepiness develops.  Staying awake becomes hard to do. Document Released: 10/27/2000 Document Revised: 09/17/2013 Document Reviewed: 09/28/2010 [facility] Patient Information 2015 [employer] [place]. This information is not intended to replace advice given to you by your health care provider. Make sure you discuss any questions you have with your health care provider.

## 2014-11-23 LAB — CBC
Hematocrit: 44.5 % (ref 37.5–51.0)
Hemoglobin: 14.6 g/dL (ref 12.6–17.7)
MCH: 31.6 pg (ref 26.6–33.0)
MCHC: 32.8 g/dL (ref 31.5–35.7)
MCV: 96 fL (ref 79–97)
PLATELETS: 115 10*3/uL — AB (ref 150–379)
RBC: 4.62 x10E6/uL (ref 4.14–5.80)
RDW: 15 % (ref 12.3–15.4)
WBC: 5.5 10*3/uL (ref 3.4–10.8)

## 2014-11-23 LAB — COMPREHENSIVE METABOLIC PANEL
ALK PHOS: 58 IU/L (ref 39–117)
ALT: 47 IU/L — ABNORMAL HIGH (ref 0–44)
AST: 43 IU/L — ABNORMAL HIGH (ref 0–40)
Albumin/Globulin Ratio: 1.5 (ref 1.1–2.5)
Albumin: 4.3 g/dL (ref 3.5–4.8)
BUN / CREAT RATIO: 13 (ref 10–22)
BUN: 9 mg/dL (ref 8–27)
Bilirubin Total: 0.7 mg/dL (ref 0.0–1.2)
CO2: 26 mmol/L (ref 18–29)
Calcium: 9.7 mg/dL (ref 8.6–10.2)
Chloride: 98 mmol/L (ref 97–108)
Creatinine, Ser: 0.72 mg/dL — ABNORMAL LOW (ref 0.76–1.27)
GFR calc non Af Amer: 91 mL/min/{1.73_m2} (ref >59)
GFR, EST AFRICAN AMERICAN: 105 mL/min/{1.73_m2} (ref >59)
Globulin, Total: 2.8 g/dL (ref 1.5–4.5)
Glucose: 94 mg/dL (ref 65–99)
POTASSIUM: 4.4 mmol/L (ref 3.5–5.2)
Sodium: 141 mmol/L (ref 134–144)
Total Protein: 7.1 g/dL (ref 6.0–8.5)

## 2014-11-23 LAB — TSH: TSH: 1.3 u[IU]/mL (ref 0.450–4.500)

## 2014-11-25 ENCOUNTER — Telehealth: Payer: Self-pay | Admitting: Neurology

## 2014-11-25 NOTE — Telephone Encounter (Signed)
Tanner SheldonAshley with [facility] is calling as the patient's insurance will not cover the Namenda Titration Pack  but they can give him the 10 mg tablet while he is getting started. Please call.

## 2014-11-25 NOTE — Telephone Encounter (Signed)
Patient has a starter pack at home I would recommend he use that and if he can tolerate the medicine I would prescribe Namenda X R 28 mg next month which hopefully the insurance will cover

## 2014-11-26 ENCOUNTER — Telehealth: Payer: Self-pay

## 2014-11-26 DIAGNOSIS — F0280 Dementia in other diseases classified elsewhere without behavioral disturbance: Secondary | ICD-10-CM | POA: Diagnosis not present

## 2014-11-26 DIAGNOSIS — F0390 Unspecified dementia without behavioral disturbance: Secondary | ICD-10-CM | POA: Diagnosis not present

## 2014-11-26 NOTE — Telephone Encounter (Signed)
Left detailed message for [facility] at [facility]. Spoke to patient's spouse and went over Dr. Marlis EdelsonSethi's instructions. Spouse verbalized understanding and said she will let the patient know.

## 2014-11-26 NOTE — Telephone Encounter (Signed)
Yes ok to give namenda xr titration pack sample

## 2014-11-26 NOTE — Telephone Encounter (Signed)
Patient returned call. He says the Namenda titration at home that he received from his PCP has expired. Advised would ask Dr. Pearlean BrownieSethi if ok for our office to give sample pack.

## 2014-11-27 ENCOUNTER — Telehealth: Payer: Self-pay

## 2014-11-27 NOTE — Telephone Encounter (Signed)
Patient was informed of lab results, Platelet count being slightly low and liver enzymes are slightly elevated, he was informed to to speak to PCP about lab work.  Patient verbalized understanding.

## 2014-11-27 NOTE — Telephone Encounter (Signed)
Called patient and informed pack will be available at front desk for pick up.  No answer. Vmail full, unable to leave message.

## 2014-11-28 DIAGNOSIS — Z952 Presence of prosthetic heart valve: Secondary | ICD-10-CM | POA: Diagnosis not present

## 2014-11-29 NOTE — Telephone Encounter (Signed)
Spoke to patient. He said he is headed out of town today will return on Monday. Will pick up med on Tuesday morning 12/03/2014. Patient wanted Dr. Pearlean BrownieSethi to know he had his CT scan done.

## 2014-12-09 DIAGNOSIS — G309 Alzheimer's disease, unspecified: Secondary | ICD-10-CM | POA: Diagnosis not present

## 2014-12-09 DIAGNOSIS — J309 Allergic rhinitis, unspecified: Secondary | ICD-10-CM | POA: Diagnosis not present

## 2014-12-09 DIAGNOSIS — Z79899 Other long term (current) drug therapy: Secondary | ICD-10-CM | POA: Diagnosis not present

## 2014-12-09 DIAGNOSIS — H109 Unspecified conjunctivitis: Secondary | ICD-10-CM | POA: Diagnosis not present

## 2014-12-09 DIAGNOSIS — R05 Cough: Secondary | ICD-10-CM | POA: Diagnosis not present

## 2014-12-10 ENCOUNTER — Encounter: Payer: Self-pay | Admitting: Neurology

## 2014-12-13 DIAGNOSIS — Z7901 Long term (current) use of anticoagulants: Secondary | ICD-10-CM | POA: Diagnosis not present

## 2014-12-13 DIAGNOSIS — Z954 Presence of other heart-valve replacement: Secondary | ICD-10-CM | POA: Diagnosis not present

## 2014-12-16 ENCOUNTER — Telehealth: Payer: Self-pay

## 2014-12-16 NOTE — Telephone Encounter (Signed)
Patient was informed of scheduled appointment for 12/20/2014 @ 11am with Dr. Pearlean Brownie.  He verbalized understanding.

## 2014-12-20 ENCOUNTER — Ambulatory Visit (INDEPENDENT_AMBULATORY_CARE_PROVIDER_SITE_OTHER): Payer: Medicare Other | Admitting: Neurology

## 2014-12-20 ENCOUNTER — Encounter: Payer: Self-pay | Admitting: Neurology

## 2014-12-20 VITALS — BP 140/70 | HR 70 | Ht 65.0 in | Wt 140.4 lb

## 2014-12-20 DIAGNOSIS — F0390 Unspecified dementia without behavioral disturbance: Secondary | ICD-10-CM

## 2014-12-20 MED ORDER — MEMANTINE HCL ER 28 MG PO CP24: 28.0000 mg | ORAL_CAPSULE | Freq: Every day | ORAL | Status: DC

## 2014-12-20 NOTE — Progress Notes (Signed)
[facility] [address] [place]. [place] [id number] [phone]       OFFICE FOLLOW UP VISIT NOTE  Mr. 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 The family has noticed progressive memory loss and mild cognitive difficulties over the last 1 year stroke. He has been increasingly forgetful in locking the house or turning of the lights when he goes out. He  also has trouble remembering names of people but he can remember this after a while. He does also have mild anxiety which seems to have increased recently but this is not any medications for that.He has bilateral hearing loss since last 5 years following complications of gentamicin usage for treatment for mitral valve endocarditis with prolonged antibiotics 5 years ago. He underwent mitral valve replacement at that time and has been on warfarin since then with a relatively stable INR. He denies any history of strokes, TIAs, speech problems, gait or balance problems. There have been no significant behavioral issues, hallucinations, delusions, significant headache noted. His gait and balance seem fine and there is no fall or safety risks identified. There is no prior history of seizures, significant head injury or loss of consciousness. Is no family history of dementia. He has not had any evaluation for treatable causes of memory loss or brain imaging done recently.he remains independent with activities of daily living and is able to take care of his own needs.   Update 05/24/2014 : He returns for follow-up today after his last visit 3 months ago accompanied by his son. He has noticed improvement in his memory as well as concentration and cognitive difficulties. He has been taking Cerefolin and reservetrol every day  . He also participates in cognitively  challenging activities. He recently traveled to [place] for a month and had slight difficulties in coping. He forgot his INR machine. He remains independent with activities of daily living and does not need help with any activities. He has no neuro neurological complaints. He underwent lab work for treatable causes of cognitive impairment which was negative. EEG showed no seizure activity. MRI scan of the brain personally reviewed shows mild changes of chronic microvascular ischemia and microhemorrhages  Update 11/22/2014 : He returns for follow-up after last visit 6 months ago. Is accompanied by his wife and son who contributed to the history. They have  noticed cognitive decline since the last visit  With the patient having good days and bad days and being confused and disoriented particularly when he has to learn some new activity in task. At times he is had trouble recognizing familiar faces on one occasion he had trouble coming up with his daughter's name. He was seen by his primary physician recently who gave him a sample pack of Namenda to try but he has not started that yet. He denies any fall, injury, focal symptoms suggestive of a stroke in the form of speech difficulties extremity weakness numbness gait or balance problems. He remains on warfarin which is tolerating well with INR being fairly stable. Update 12/20/2014 : He returns for follow-up after last visit 1 month ago. He initially reported some side effects on Namenda in the form of cough and feeling strange but that seems to have settled down. He was having some bad dreams initially and that also seems to be improving. He  feels overall he is quite well during the day with better memory and concentration. However at nights he is restless and often wakes up early in the morning. He does Nap for 10-15 minutes during the daytime but does manage to sleep 5-6 hours every night. He states his INR has been quite stable and he is on a fairly regular dose of  warfarin. On Mini-Mental status exam today scored 27/30 which is much improved from 22/30 at last visit. ROS:   14 system review of systems is positive for  hearing loss, ringing in the ears, frequent waking, daytime sleepiness,   decreased concentration, anxiety and memory loss. And all other systems negative PMH:  Past Medical History  Diagnosis Date  . Coronary artery disease   . Hypertension   . Hyperlipemia   . Arthritis     Social History:  History   Social History  . Marital Status: Married    Spouse Name: N/A  . Number of Children: 3  . Years of Education: MD   Occupational History  . Not on file.   Social History Main Topics  . Smoking status: Never Smoker   . Smokeless tobacco: Never Used  . Alcohol Use: 0.0 oz/week    0 Standard drinks or equivalent per week     Comment: occ  . Drug Use: Yes    Special: Hydrocodone  . Sexual Activity: Not on file   Other Topics Concern  . Not on file   Social History Narrative   Patient is married with 3 children.   Patient is right handed.   Patient is a Proofreader.   Patient drinks 2 cups daily.    Medications:   Current Outpatient Prescriptions on File Prior to Visit  Medication Sig Dispense Refill  . allopurinol (ZYLOPRIM) 300 MG tablet Take 300 mg by mouth daily. Taking 1/2 tablet daily    . atorvastatin (LIPITOR) 80 MG tablet Take 80 mg by mouth daily.    . chlorhexidine (PERIDEX) 0.12 % solution daily. as directed  99  . enoxaparin (LOVENOX) 60 MG/0.6ML injection     . ezetimibe (ZETIA) 10 MG tablet Take 10 mg by mouth daily.    Marland Kitchen glucosamine-chondroitin (GLUCOSAMINE-CHONDROITIN DS) 500-400 MG tablet Take by mouth.    . Methylfol-Methylcob-Acetylcyst (CEREFOLIN NAC) 6-2-600 MG TABS Take 1 tablet by mouth 1 day or 1 dose. 90 each 3  . metoprolol (LOPRESSOR) 50 MG tablet     . warfarin (COUMADIN) 6 MG tablet Take 6 mg by mouth daily.    . metoprolol succinate (TOPROL-XL) 25 MG 24 hr tablet Take 25 mg by  mouth daily.    Marland Kitchen zolpidem (AMBIEN) 5 MG tablet Take 5 mg by mouth at bedtime as needed for sleep.     No current facility-administered medications on file prior to visit.    Allergies:   Allergies  Allergen Reactions  . Penicillins     Physical Exam General: well developed, well nourished, elderly [place] male seated, in no evident distress Head: head normocephalic and atraumatic.   Neck: supple with no carotid or supraclavicular bruits Cardiovascular: regular rate and rhythm, no murmurs Musculoskeletal: no deformity Skin:  no rash/petichiae Vascular:  Normal pulses all extremities Filed Vitals:   12/20/14 1114  BP: 140/70  Pulse: 70    Neurologic Exam Mental Status: Awake and fully alert. Oriented to place and person only. Recent and remote memory diminished. Attention span, concentration diminished  and fund of knowledge appropriate. Mood and  affect appropriate. MMSE 27/30 with deficits in orientation, attention, calculation      Able to draw intersecting pentagons. Animal fluency test 11 which is normal. Clock drawing 2/4. Cranial Nerves: Fundoscopic exam not done . Pupils equal, briskly reactive to light. Extraocular movements full without nystagmus. Visual fields full to confrontation. Hearing reduced bilaterallyt. Facial sensation intact. Face, tongue, palate moves normally and symmetrically.  Motor: Normal bulk and tone. Normal strength in all tested extremity muscles. Sensory.: intact to touch , pinprick , position and vibratory sensation.  Coordination: Rapid alternating movements normal in all extremities. Finger-to-nose and heel-to-shin performed accurately bilaterally. Gait and Station: Arises from chair without difficulty. Stance is normal. Gait demonstrates normal stride length and balance . Unable to heel, toe and tandem walk without difficulty.  Reflexes: 1+ and symmetric. Toes downgoing.      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 PLAN:  I had a long discussion with the patient, wife and son regarding his mild dementia which seems to have responded to [place]. I gave him a prescription for Namenda XR 28 mg daily and advised him to stick to a set  routine. He was advised to try melatonin 3 mg at night for sleep and to avoid taking benzodiazepine's. Continue warfarin for stroke prevention. Return for follow-up in 3 months or call earlier if necessary.  Delia Heady, MD Note: This document was prepared with digital dictation and possible smart phrase technology. Any transcriptional errors that result from this process are unintentional.

## 2014-12-20 NOTE — Patient Instructions (Signed)
I had a long discussion with the patient, wife and son regarding his mild dementia which seems to have responded to [place]. I gave him a prescription for Namenda XR 28 mg daily and advised him to stick to a set  routine. He was advised to try melatonin 3 mg at night for sleep and to avoid taking benzodiazepine's. Continue warfarin for stroke prevention. Return for follow-up in 3 months or call earlier if necessary.

## 2014-12-26 DIAGNOSIS — Z952 Presence of prosthetic heart valve: Secondary | ICD-10-CM | POA: Diagnosis not present

## 2015-01-10 ENCOUNTER — Telehealth: Payer: Self-pay | Admitting: Neurology

## 2015-01-10 NOTE — Telephone Encounter (Signed)
Patient called to advise he will be faxing forms for memantine (NAMENDA XR) 28 MG CP24 24 hr capsule

## 2015-01-13 NOTE — Telephone Encounter (Signed)
Form given to Dr.Sethi to filled out and sign.

## 2015-01-14 NOTE — Telephone Encounter (Signed)
Rn call patient on his home number concerning the form for namedna for his pharmacy. Rn stated the form will be fax to the number provided. Pt requested the form be mail to him for his insurance purposes. Pt confirm address and letter form coverage for namenda was put in the mail, and it was also fax to [employer].

## 2015-01-23 DIAGNOSIS — Z952 Presence of prosthetic heart valve: Secondary | ICD-10-CM | POA: Diagnosis not present

## 2015-01-29 ENCOUNTER — Telehealth: Payer: Self-pay

## 2015-01-29 ENCOUNTER — Ambulatory Visit: Payer: Medicare Other | Admitting: Neurology

## 2015-01-29 NOTE — Telephone Encounter (Signed)
[employer] has approved the request for coverage on Namenda effective until the policy changes or is terminated Ref ID # [id number]

## 2015-01-30 DIAGNOSIS — Z7901 Long term (current) use of anticoagulants: Secondary | ICD-10-CM | POA: Diagnosis not present

## 2015-01-31 DIAGNOSIS — R0602 Shortness of breath: Secondary | ICD-10-CM | POA: Diagnosis not present

## 2015-02-04 DIAGNOSIS — Z7901 Long term (current) use of anticoagulants: Secondary | ICD-10-CM | POA: Diagnosis not present

## 2015-02-04 DIAGNOSIS — Z952 Presence of prosthetic heart valve: Secondary | ICD-10-CM | POA: Diagnosis not present

## 2015-02-04 DIAGNOSIS — H04203 Unspecified epiphora, bilateral lacrimal glands: Secondary | ICD-10-CM | POA: Diagnosis not present

## 2015-02-08 DIAGNOSIS — Z23 Encounter for immunization: Secondary | ICD-10-CM | POA: Diagnosis not present

## 2015-02-20 DIAGNOSIS — Z7901 Long term (current) use of anticoagulants: Secondary | ICD-10-CM | POA: Diagnosis not present

## 2015-02-20 DIAGNOSIS — Z952 Presence of prosthetic heart valve: Secondary | ICD-10-CM | POA: Diagnosis not present

## 2015-02-21 ENCOUNTER — Other Ambulatory Visit: Payer: Self-pay | Admitting: Neurology

## 2015-02-24 DIAGNOSIS — Z7901 Long term (current) use of anticoagulants: Secondary | ICD-10-CM | POA: Diagnosis not present

## 2015-02-25 DIAGNOSIS — Z7901 Long term (current) use of anticoagulants: Secondary | ICD-10-CM | POA: Diagnosis not present

## 2015-03-03 DIAGNOSIS — H02105 Unspecified ectropion of left lower eyelid: Secondary | ICD-10-CM | POA: Diagnosis not present

## 2015-03-03 DIAGNOSIS — H02102 Unspecified ectropion of right lower eyelid: Secondary | ICD-10-CM | POA: Diagnosis not present

## 2015-03-11 ENCOUNTER — Telehealth: Payer: Self-pay | Admitting: Neurology

## 2015-03-11 NOTE — Telephone Encounter (Addendum)
Pt called inquiring if he needs to renew memantine (NAMENDA XR) 28 MG CP24 24 hr capsule and if so for how long. Please call and advise at [phone]. Pt requested to speak with Dr Pearlean BrownieSethi

## 2015-03-12 NOTE — Telephone Encounter (Signed)
I called patient's listed phone number but was unable to leave a message as voice mailbox was full. I called his home number and left a message on his answering machine stating that he needs to take Namenda life long. He was instructed to call me back if he had any further questions.

## 2015-03-12 NOTE — Telephone Encounter (Signed)
I called and spoke with the pharmacy.  Co-pay for Namenda XR (which is brand name only) is $178 per month.  I called ins.  Spoke with [employer].  He said the generic Namenda 10mg  twice daily version would be ~$40 per fill.

## 2015-03-13 NOTE — Telephone Encounter (Signed)
I spoke to the patient and discussed options of switching to generic Namenda 10 mg twice daily which would be cheaper. He informed me that he will think about this and call and let us know if he wants to do so to get a new prescription

## 2015-03-17 DIAGNOSIS — H02102 Unspecified ectropion of right lower eyelid: Secondary | ICD-10-CM | POA: Diagnosis not present

## 2015-03-17 DIAGNOSIS — H04563 Stenosis of bilateral lacrimal punctum: Secondary | ICD-10-CM | POA: Diagnosis not present

## 2015-03-17 DIAGNOSIS — H02105 Unspecified ectropion of left lower eyelid: Secondary | ICD-10-CM | POA: Diagnosis not present

## 2015-03-19 DIAGNOSIS — Z7901 Long term (current) use of anticoagulants: Secondary | ICD-10-CM | POA: Diagnosis not present

## 2015-03-26 ENCOUNTER — Ambulatory Visit (INDEPENDENT_AMBULATORY_CARE_PROVIDER_SITE_OTHER): Payer: Medicare Other | Admitting: Neurology

## 2015-03-26 ENCOUNTER — Encounter: Payer: Self-pay | Admitting: Neurology

## 2015-03-26 VITALS — BP 117/64 | HR 56 | Ht 65.0 in | Wt 143.6 lb

## 2015-03-26 DIAGNOSIS — F0390 Unspecified dementia without behavioral disturbance: Secondary | ICD-10-CM

## 2015-03-26 DIAGNOSIS — Z952 Presence of prosthetic heart valve: Secondary | ICD-10-CM | POA: Diagnosis not present

## 2015-03-26 NOTE — Progress Notes (Signed)
[facility] [address] [place]. [place] [id number] [phone]       OFFICE FOLLOW UP VISIT NOTE  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 The family has noticed progressive memory loss and mild cognitive difficulties over the last 1 year stroke. He has been increasingly forgetful in locking the house or turning of the lights when he goes out. He  also has trouble remembering names of people but he can remember this after a while. He does also have mild anxiety which seems to have increased recently but this is not any medications for that.He has bilateral hearing loss since last 5 years following complications of gentamicin usage for treatment for mitral valve endocarditis with prolonged antibiotics 5 years ago. He underwent mitral valve replacement at that time and has been on warfarin since then with a relatively stable INR. He denies any history of strokes, TIAs, speech problems, gait or balance problems. There have been no significant behavioral issues, hallucinations, delusions, significant headache noted. His gait and balance seem fine and there is no fall or safety risks identified. There is no prior history of seizures, significant head injury or loss of consciousness. Is no family history of dementia. He has not had any evaluation for treatable causes of memory loss or brain imaging done recently.he remains independent with activities of daily living and is able to take care of his own needs.   Update 05/24/2014 : He returns for follow-up today after his last visit 3 months ago accompanied by his son. He has noticed improvement in his memory as well as concentration and cognitive difficulties. He has been taking Cerefolin and reservetrol every day  . He also participates in cognitively  challenging activities. He recently traveled to [place] for a month and had slight difficulties in coping. He forgot his INR machine. He remains independent with activities of daily living and does not need help with any activities. He has no neuro neurological complaints. He underwent lab work for treatable causes of cognitive impairment which was negative. EEG showed no seizure activity. MRI scan of the brain personally reviewed shows mild changes of chronic microvascular ischemia and microhemorrhages  Update 11/22/2014 : He returns for follow-up after last visit 6 months ago. Is accompanied by his wife and son who contributed to the history. They have  noticed cognitive decline since the last visit  With the patient having good days and bad days and being confused and disoriented particularly when he has to learn some new activity in task. At times he is had trouble recognizing familiar faces on one occasion he had trouble coming up with his daughter's name. He was seen by his primary physician recently who gave him a sample pack of Namenda to try but he has not started that yet. He denies any fall, injury, focal symptoms suggestive of a stroke in the form of speech difficulties extremity weakness numbness gait or balance problems. He remains on warfarin which is tolerating well with INR being fairly stable. Update 12/20/2014 : He returns for follow-up after last visit 1 month ago. He initially reported some side effects on Namenda in the form of cough and feeling strange but that seems to have settled down. He was having some bad dreams initially and that also seems to be improving. He  feels overall he is quite well during the day with better memory and concentration. However at nights he is restless and often wakes up early in the morning. He does Nap for 10-15 minutes during the daytime but does manage to sleep 5-6 hours every night. He states his INR has been quite stable and he is on a fairly regular dose of  warfarin. On Mini-Mental status exam today scored 27/30 which is much improved from 22/30 at last visit. 03/26/2015 : He returns for follow-up to last visit 3 months ago. Is accompanied by his wife. Patient continues to have noticed improvement in his memory and cognition after taking Namenda XR 28 mg daily. He keeps himself busy by solving crossword puzzles, doing sudoku and computer brain games. He had recent eye surgery for blocked lacrimal ducts which went well. He does have some bruises from the surgery on his periorbital region. The patient has several questions about his driving as well as ability to travel alone to visit his son in [place]. On Mini-Mental status exam today scored 25/30 which is not significantly changed from 27/30 at last visit. He was able to name 14 four legged animals and clock drawing score was 3/4.He is sleeping better. And has no complaints  ROS:   14 system review of systems is positive for  hearing loss, ringing in the ears, eye redness and memory loss. And all other systems negative PMH:  Past Medical History  Diagnosis Date  . Coronary artery disease   . Hypertension   . Hyperlipemia   . Arthritis   . Memory loss     Social History:  Social History   Social History  . Marital Status: Married    Spouse Name: N/A  . Number of Children: 3  . Years of Education: MD   Occupational History  . Not on file.   Social History Main Topics  . Smoking status: Never Smoker   . Smokeless tobacco: Never Used  . Alcohol Use: 0.0 oz/week    0 Standard drinks or equivalent per week     Comment: occ  . Drug Use: Yes    Special: Hydrocodone  . Sexual Activity: Not on file   Other Topics Concern  . Not on file   Social History Narrative   Patient is married with 3 children.   Patient is right handed.   Patient is a Proofreader.   Patient drinks 2 cups daily.    Medications:   Current Outpatient Prescriptions on File Prior to Visit  Medication Sig  Dispense Refill  . allopurinol (ZYLOPRIM) 300 MG tablet Take 300 mg by mouth daily. Taking 1/2 tablet daily    . atorvastatin (LIPITOR) 80 MG tablet Take 80 mg by mouth daily.    . cetirizine (ZYRTEC) 5 MG tablet Take 5 mg by mouth daily.  0  . chlorhexidine (PERIDEX) 0.12 % solution daily. as directed  99  . chlorpheniramine-HYDROcodone (TUSSIONEX) 10-8 MG/5ML SUER TAKE ONE TEASPOONFUL 95 MILLILITERS) BY MOUTH EVERY TWELVE HOURS AS NEEDED  0  . enoxaparin (LOVENOX) 60 MG/0.6ML injection     . ezetimibe (ZETIA) 10 MG tablet Take 10 mg by mouth daily.    Marland Kitchen glucosamine-chondroitin (GLUCOSAMINE-CHONDROITIN DS) 500-400 MG tablet Take by mouth.    . memantine (NAMENDA XR) 28 MG CP24 24 hr capsule Take 1 capsule (28 mg total) by mouth daily. 90 capsule 3  . Methylfol-Algae-B12-Acetylcyst (CEREFOLIN NAC) 6-90.314-2-600 MG TABS Take one tablet by mouth one time daily 90  tablet 4  . Methylfol-Methylcob-Acetylcyst (CEREFOLIN NAC) 6-2-600 MG TABS Take 1 tablet by mouth 1 day or 1 dose. 90 each 3  . metoprolol (LOPRESSOR) 50 MG tablet     . metoprolol succinate (TOPROL-XL) 25 MG 24 hr tablet Take 25 mg by mouth daily.    Marland Kitchen neomycin-polymyxin b-dexamethasone (MAXITROL) 3.5-10000-0.1 SUSP INSTILL ONE DROP IN EACH EYE FOUR TIMES DAILY  0  . warfarin (COUMADIN) 6 MG tablet Take 6 mg by mouth daily.     No current facility-administered medications on file prior to visit.    Allergies:   Allergies  Allergen Reactions  . Penicillins     Physical Exam General: well developed, well nourished, elderly [place] male seated, in no evident distress Head: head normocephalic and atraumatic.   Neck: supple with no carotid or supraclavicular bruits Cardiovascular: regular rate and rhythm, no murmurs Musculoskeletal: no deformity Skin:  no rash/petichiae. Bruises bilateral infraorbitally from recent eye surgery Vascular:  Normal pulses all extremities Filed Vitals:   03/26/15 1102  BP: 117/64    Pulse: 56    Neurologic Exam Mental Status: Awake and fully alert. Oriented to place and person only. Recent and remote memory diminished. Attention span, concentration diminished  and fund of knowledge appropriate. Mood and affect appropriate. MMSE 25/30 with deficits in orientation, attention, calculation      Able to draw intersecting pentagons. Animal fluency test 14 which is normal. Clock drawing 3/4.  Cranial Nerves: Fundoscopic exam not done . Pupils equal, briskly reactive to light. Extraocular movements full without nystagmus. Visual fields full to confrontation. Hearing reduced bilaterallyt. Facial sensation intact. Face, tongue, palate moves normally and symmetrically.  Motor: Normal bulk and tone. Normal strength in all tested extremity muscles. Sensory.: intact to touch , pinprick , position and vibratory sensation.  Coordination: Rapid alternating movements normal in all extremities. Finger-to-nose and heel-to-shin performed accurately bilaterally. Gait and Station: Arises from chair without difficulty. Stance is normal. Gait demonstrates normal stride length and balance . Unable to heel, toe and tandem walk without difficulty.  Reflexes: 1+ and symmetric. Toes downgoing.      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 PLAN:  I had a long discussion with the patient and his wife regarding his mild dementia which appears fairly stable on the current dose of Namenda XR 28 mg daily. I encouraged him to continue participation in cognitively challenging activities like solving crossword puzzles, sudoku, playing bridge. I advised him to limit his driving to only familiar routes. He was advised to return for follow-up in 6 months or call earlier if necessary.  Delia Heady, MD Note: This document was prepared with digital dictation and possible smart phrase technology.  Any transcriptional errors that result from this process are unintentional.

## 2015-03-26 NOTE — Patient Instructions (Signed)
I had a long discussion with the patient and his wife regarding his mild dementia which appears fairly stable on the current dose of Namenda XR 28 mg daily. I encouraged him to continue participation in cognitively challenging activities like solving crossword puzzles, sudoku, playing bridge. I advised him to limit his driving to only familiar routes. He was advised to return for follow-up in 6 months or call earlier if necessary.

## 2015-05-01 DIAGNOSIS — Z952 Presence of prosthetic heart valve: Secondary | ICD-10-CM | POA: Diagnosis not present

## 2015-05-28 DIAGNOSIS — Z952 Presence of prosthetic heart valve: Secondary | ICD-10-CM | POA: Diagnosis not present

## 2015-05-28 DIAGNOSIS — Z7901 Long term (current) use of anticoagulants: Secondary | ICD-10-CM | POA: Diagnosis not present

## 2015-06-10 DIAGNOSIS — A09 Infectious gastroenteritis and colitis, unspecified: Secondary | ICD-10-CM | POA: Diagnosis not present

## 2015-06-12 DIAGNOSIS — Z9889 Other specified postprocedural states: Secondary | ICD-10-CM | POA: Diagnosis not present

## 2015-06-12 DIAGNOSIS — Z7901 Long term (current) use of anticoagulants: Secondary | ICD-10-CM | POA: Diagnosis not present

## 2015-06-14 DIAGNOSIS — D689 Coagulation defect, unspecified: Secondary | ICD-10-CM | POA: Diagnosis not present

## 2015-06-16 DIAGNOSIS — M25511 Pain in right shoulder: Secondary | ICD-10-CM | POA: Diagnosis not present

## 2015-06-18 DIAGNOSIS — E785 Hyperlipidemia, unspecified: Secondary | ICD-10-CM | POA: Diagnosis not present

## 2015-06-18 DIAGNOSIS — Z954 Presence of other heart-valve replacement: Secondary | ICD-10-CM | POA: Insufficient documentation

## 2015-06-18 DIAGNOSIS — Z7901 Long term (current) use of anticoagulants: Secondary | ICD-10-CM | POA: Diagnosis not present

## 2015-06-19 DIAGNOSIS — M542 Cervicalgia: Secondary | ICD-10-CM | POA: Diagnosis not present

## 2015-06-19 DIAGNOSIS — M25511 Pain in right shoulder: Secondary | ICD-10-CM | POA: Diagnosis not present

## 2015-06-25 DIAGNOSIS — M25511 Pain in right shoulder: Secondary | ICD-10-CM | POA: Diagnosis not present

## 2015-06-25 DIAGNOSIS — M542 Cervicalgia: Secondary | ICD-10-CM | POA: Diagnosis not present

## 2015-06-26 DIAGNOSIS — Z952 Presence of prosthetic heart valve: Secondary | ICD-10-CM | POA: Diagnosis not present

## 2015-06-27 DIAGNOSIS — M542 Cervicalgia: Secondary | ICD-10-CM | POA: Diagnosis not present

## 2015-06-27 DIAGNOSIS — M25511 Pain in right shoulder: Secondary | ICD-10-CM | POA: Diagnosis not present

## 2015-07-01 DIAGNOSIS — M7671 Peroneal tendinitis, right leg: Secondary | ICD-10-CM | POA: Diagnosis not present

## 2015-07-01 DIAGNOSIS — M24574 Contracture, right foot: Secondary | ICD-10-CM | POA: Diagnosis not present

## 2015-07-01 DIAGNOSIS — M24571 Contracture, right ankle: Secondary | ICD-10-CM | POA: Diagnosis not present

## 2015-07-02 DIAGNOSIS — Z7901 Long term (current) use of anticoagulants: Secondary | ICD-10-CM | POA: Diagnosis not present

## 2015-07-02 DIAGNOSIS — Z954 Presence of other heart-valve replacement: Secondary | ICD-10-CM | POA: Diagnosis not present

## 2015-07-02 DIAGNOSIS — E785 Hyperlipidemia, unspecified: Secondary | ICD-10-CM | POA: Diagnosis not present

## 2015-07-02 DIAGNOSIS — Z9889 Other specified postprocedural states: Secondary | ICD-10-CM | POA: Diagnosis not present

## 2015-07-24 DIAGNOSIS — Z952 Presence of prosthetic heart valve: Secondary | ICD-10-CM | POA: Diagnosis not present

## 2015-07-30 DIAGNOSIS — Z954 Presence of other heart-valve replacement: Secondary | ICD-10-CM | POA: Diagnosis not present

## 2015-07-30 DIAGNOSIS — Z7901 Long term (current) use of anticoagulants: Secondary | ICD-10-CM | POA: Diagnosis not present

## 2015-07-31 DIAGNOSIS — Z7901 Long term (current) use of anticoagulants: Secondary | ICD-10-CM | POA: Diagnosis not present

## 2015-07-31 DIAGNOSIS — Z954 Presence of other heart-valve replacement: Secondary | ICD-10-CM | POA: Diagnosis not present

## 2015-08-07 DIAGNOSIS — Z7901 Long term (current) use of anticoagulants: Secondary | ICD-10-CM | POA: Diagnosis not present

## 2015-08-07 DIAGNOSIS — I482 Chronic atrial fibrillation: Secondary | ICD-10-CM | POA: Diagnosis not present

## 2015-08-21 DIAGNOSIS — Z952 Presence of prosthetic heart valve: Secondary | ICD-10-CM | POA: Diagnosis not present

## 2015-08-26 DIAGNOSIS — Z7901 Long term (current) use of anticoagulants: Secondary | ICD-10-CM | POA: Diagnosis not present

## 2015-08-26 DIAGNOSIS — I4891 Unspecified atrial fibrillation: Secondary | ICD-10-CM | POA: Diagnosis not present

## 2015-09-18 DIAGNOSIS — Z952 Presence of prosthetic heart valve: Secondary | ICD-10-CM | POA: Diagnosis not present

## 2015-09-19 ENCOUNTER — Telehealth: Payer: Self-pay | Admitting: *Deleted

## 2015-09-19 NOTE — Telephone Encounter (Signed)
LVM requesting call back, re: appointment next week. Left name, number, office hours. 

## 2015-09-22 ENCOUNTER — Telehealth: Payer: Self-pay | Admitting: *Deleted

## 2015-09-22 NOTE — Telephone Encounter (Signed)
Spoke with wife re: patient seeing Enid Skeens Martin, NP for follow up on Wed. She spoke with patient, and he agreed. Appointment rescheduled with Enid Skeens Martin, NP for 10:15 am, advised to arrive 15 min early. Wife verbalized understanding, agreement.

## 2015-09-24 ENCOUNTER — Ambulatory Visit: Payer: Medicare Other | Admitting: Neurology

## 2015-09-24 ENCOUNTER — Encounter: Payer: Self-pay | Admitting: Nurse Practitioner

## 2015-09-24 ENCOUNTER — Ambulatory Visit (INDEPENDENT_AMBULATORY_CARE_PROVIDER_SITE_OTHER): Payer: Medicare Other | Admitting: Nurse Practitioner

## 2015-09-24 VITALS — BP 118/65 | HR 60 | Ht 65.0 in | Wt 143.0 lb

## 2015-09-24 DIAGNOSIS — R413 Other amnesia: Secondary | ICD-10-CM

## 2015-09-24 DIAGNOSIS — F0390 Unspecified dementia without behavioral disturbance: Secondary | ICD-10-CM

## 2015-09-24 DIAGNOSIS — G3184 Mild cognitive impairment, so stated: Secondary | ICD-10-CM

## 2015-09-24 MED ORDER — MEMANTINE HCL ER 28 MG PO CP24: 28.0000 mg | ORAL_CAPSULE | Freq: Every day | ORAL | Status: DC

## 2015-09-24 NOTE — Patient Instructions (Signed)
Continue to participate in cognitively challenging activities like solving crossword puzzles, sudoku, playing bridge.  Drive only in familiar routes Memory score is stable Continue Namenda at current dose will refill Follow-up in 6 months

## 2015-09-24 NOTE — Progress Notes (Signed)
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 The family has noticed progressive memory loss and mild cognitive difficulties over the last 1 year stroke. He has been increasingly forgetful in locking the house or turning of the lights when he goes out. He also has trouble remembering names of people but he can remember this after a while. He does also have mild anxiety which seems to have increased recently but this is not any medications for that.He has bilateral hearing loss since last 5 years following complications of gentamicin usage for treatment for mitral valve endocarditis with prolonged antibiotics 5 years ago. He underwent mitral valve replacement at that time and has been on warfarin since then with a relatively stable INR. He denies any history of strokes, TIAs, speech problems, gait or balance problems. There have been no significant behavioral issues, hallucinations, delusions, significant headache noted. His gait and balance seem fine and there is no fall or safety risks identified. There is no prior history of seizures, significant head injury or loss of consciousness. Is no family history of dementia. He has not had any evaluation for treatable causes of memory loss or brain imaging done recently.he remains independent with activities of daily living and is able to take care of his own needs.  Update 1/8/2016PS : He returns for follow-up today after his last visit 3 months ago accompanied by his son. He has noticed improvement in his memory as well as concentration and cognitive difficulties. He has been taking Cerefolin and reservetrol every day . He also participates in cognitively challenging activities. He recently traveled to [place] for a month and  had slight difficulties in coping. He forgot his INR machine. He remains independent with activities of daily living and does not need help with any activities. He has no neuro neurological complaints. He underwent lab work for treatable causes of cognitive impairment which was negative. EEG showed no seizure activity. MRI scan of the brain personally reviewed shows mild changes of chronic microvascular ischemia and microhemorrhages  Update 11/22/2014 PS: He returns for follow-up after last visit 6 months ago. Is accompanied by his wife and son who contributed to the history. They have noticed cognitive decline since the last visit With the patient having good days and bad days and being confused and disoriented particularly when he has to learn some new activity in task. At times he is had trouble recognizing familiar faces on one occasion he had trouble coming up with his daughter's name. He was seen by his primary physician recently who gave him a sample pack of Namenda to try but he has not started that yet. He denies any fall, injury, focal symptoms suggestive of a stroke in the form of speech difficulties extremity weakness numbness gait or balance problems. He remains on warfarin which is tolerating well with INR being fairly stable. Update 12/20/2014 PS: He returns for follow-up after last visit 1 month ago. He initially reported some side effects on Namenda in the form of cough and feeling strange but that seems to have settled down. He was having some bad dreams initially and that also seems to be improving. He feels overall he is quite well during the day with better memory and concentration. However at nights he is restless and  often wakes up early in the morning. He does Nap for 10-15 minutes during the daytime but does manage to sleep 5-6 hours every night. He states his INR has been quite stable and he is on a fairly regular dose of warfarin. On Mini-Mental status exam today scored 27/30 which is  much improved from 22/30 at last visit.  11/9/2016PS : He returns for follow-up to last visit 3 months ago. Is accompanied by his wife. Patient continues to have noticed improvement in his memory and cognition after taking Namenda XR 28 mg daily. He keeps himself busy by solving crossword puzzles, doing sudoku and computer brain games. He had recent eye surgery for blocked lacrimal ducts which went well. He does have some bruises from the surgery on his periorbital region. The patient has several questions about his driving as well as ability to travel alone to visit his [place] in [place]. On Mini-Mental status exam today scored 25/30 which is not significantly changed from 27/30 at last visit. He was able to name 14 four legged animals and clock drawing score was 3/4.He is sleeping better. And has no complaints UPDATE 05/10/2017CM Patient returns for six-month follow-up with his wife for memory loss. He and his wife both think his memory is about the same. He is taking Namenda XR 28 mg daily. He continues to do games on the computer. He continues to drive short distances in familiar places without getting loss. MMSE today 25 out of 30 unchanged animal fluency 14 unchanged clock drawing 3 out of 4 which is also unchanged from 6 months ago. He has no new complaints.    REVIEW OF SYSTEMS: Full 14 system review of systems performed and notable only for those listed, all others are neg:  Constitutional: neg  Cardiovascular: neg Ear/Nose/Throat: Hearing loss Skin: neg Eyes: neg Respiratory: neg Gastroitestinal: Urinary urgency  Hematology/Lymphatic: neg  Endocrine: neg Musculoskeletal:neg Allergy/Immunology: neg Neurological: Memory loss Psychiatric: neg Sleep : neg   ALLERGIES: Allergies  Allergen Reactions  . Ampicillin Other (See Comments)    Ask Patient  . Niacin Other (See Comments)    Gout  . Penicillins     HOME MEDICATIONS: Outpatient Prescriptions Prior to Visit  Medication  Sig Dispense Refill  . allopurinol (ZYLOPRIM) 300 MG tablet Take 300 mg by mouth daily. Taking 1/2 tablet daily    . atorvastatin (LIPITOR) 80 MG tablet Take 80 mg by mouth daily.    . cetirizine (ZYRTEC) 5 MG tablet Take 5 mg by mouth daily.  0  . chlorhexidine (PERIDEX) 0.12 % solution daily. as directed  99  . enoxaparin (LOVENOX) 60 MG/0.6ML injection     . ezetimibe (ZETIA) 10 MG tablet Take 10 mg by mouth daily.    Marland Kitchen. glucosamine-chondroitin (GLUCOSAMINE-CHONDROITIN DS) 500-400 MG tablet Take by mouth.    . memantine (NAMENDA XR) 28 MG CP24 24 hr capsule Take 1 capsule (28 mg total) by mouth daily. 90 capsule 3  . Methylfol-Algae-B12-Acetylcyst (CEREFOLIN NAC) 6-90.314-2-600 MG TABS Take one tablet by mouth one time daily 90 tablet 4  . Methylfol-Methylcob-Acetylcyst (CEREFOLIN NAC) 6-2-600 MG TABS Take 1 tablet by mouth 1 day or 1 dose. 90 each 3  . metoprolol (LOPRESSOR) 50 MG tablet     . metoprolol succinate (TOPROL-XL) 25 MG 24 hr tablet Take 25 mg by mouth daily.    Marland Kitchen. neomycin-polymyxin b-dexamethasone (MAXITROL) 3.5-10000-0.1 SUSP INSTILL ONE DROP IN EACH EYE FOUR TIMES DAILY  0  . warfarin (COUMADIN) 6 MG tablet Take  6 mg by mouth daily.    . chlorpheniramine-HYDROcodone (TUSSIONEX) 10-8 MG/5ML SUER TAKE ONE TEASPOONFUL 95 MILLILITERS) BY MOUTH EVERY TWELVE HOURS AS NEEDED  0  . FLULAVAL QUADRIVALENT injection See admin instructions.  0   No facility-administered medications prior to visit.    PAST MEDICAL HISTORY: Past Medical History  Diagnosis Date  . Coronary artery disease   . Hypertension   . Hyperlipemia   . Arthritis   . Memory loss     PAST SURGICAL HISTORY: Past Surgical History  Procedure Laterality Date  . Coronary artery bypass graft    . Mitral valve surgery    . Eye surgery      both cataracts  . Tonsillectomy    . Prostate biopsy  2002  . Shoulder arthroscopy w/ rotator cuff repair  2002    left-dsc  . Shoulder arthroscopy      right  . Trigger  finger release Left 11/07/2012    Procedure: RELEASE TRIGGER FINGER/A-1 PULLEY LEFT RING;  Surgeon: Wyn Forster., MD;  [facility]  [facility];  Service: Orthopedics;  Laterality: Left;    FAMILY HISTORY: History reviewed. No pertinent family history.  SOCIAL HISTORY: Social History   Social History  . Marital Status: Married    Spouse Name: N/A  . Number of Children: 3  . Years of Education: MD   Occupational History  . Not on file.   Social History Main Topics  . Smoking status: Never Smoker   . Smokeless tobacco: Never Used  . Alcohol Use: 0.0 oz/week    0 Standard drinks or equivalent per week     Comment: occ  . Drug Use: Yes    Special: Hydrocodone  . Sexual Activity: Not on file   Other Topics Concern  . Not on file   Social History Narrative   Patient is married with 3 children.   Patient is right handed.   Patient is a Proofreader.   Patient drinks 2 cups daily.     PHYSICAL EXAM  Filed Vitals:   09/24/15 1005  BP: 118/65  Pulse: 60  Height: 5\' 5"  (1.651 m)  Weight: 143 lb (64.864 kg)   Body mass index is 23.8 kg/(m^2). General: well developed, well nourished, elderly  male seated, in no evident distress Head: head normocephalic and atraumatic.  Neck: supple with no carotid  bruits Cardiovascular: regular rate and rhythm, no murmurs Musculoskeletal: no deformity Skin: no rash/petichiae.  Vascular: Normal pulses all extremities  Neurological examination   Mentation:Awake and fully alert. Oriented to place and person only. Recent and remote memory diminished. Attention span, concentration diminished and fund of knowledge appropriate. Mood and affect appropriate. MMSE 25/30 with deficits in orientation, recall and copying a figure.  Animal fluency test 14 which is normal. Clock drawing 3/4.  Cranial Nerves:  Pupils equal, briskly reactive to light. Extraocular movements full without nystagmus. Visual fields full to  confrontation. Hearing reduced bilaterallyt. Facial sensation intact. Face, tongue, palate moves normally and symmetrically.  Motor: Normal bulk and tone. Normal strength in all tested extremity muscles. Sensory.: intact to touch , pinprick , position and vibratory sensation.  Coordination: Rapid alternating movements normal in all extremities. Finger-to-nose and heel-to-shin performed accurately bilaterally. Gait and Station: Arises from chair without difficulty. Stance is normal. Gait demonstrates normal stride length and balance . Unable to heel, toe and tandem walk without difficulty. No assistive device Reflexes: 1+ and symmetric. Toes downgoing.    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 Memory score is stable  PLAN: Continue to participate in cognitively challenging activities like solving crossword puzzles, sudoku, playing bridge.  Drive only in familiar routes Memory score is stable Continue Namenda at current dose will refill Follow-up in 6 months Nilda Riggs, [facility], [facility], APRN  [facility] [address], [address] [place], [place] [address] [phone]

## 2015-09-25 NOTE — Progress Notes (Signed)
I agree with the above plan 

## 2015-10-03 DIAGNOSIS — Z7901 Long term (current) use of anticoagulants: Secondary | ICD-10-CM | POA: Diagnosis not present

## 2015-10-03 DIAGNOSIS — Z9889 Other specified postprocedural states: Secondary | ICD-10-CM | POA: Diagnosis not present

## 2015-10-08 DIAGNOSIS — Z9889 Other specified postprocedural states: Secondary | ICD-10-CM | POA: Diagnosis not present

## 2015-10-08 DIAGNOSIS — Z7901 Long term (current) use of anticoagulants: Secondary | ICD-10-CM | POA: Diagnosis not present

## 2015-10-16 DIAGNOSIS — M7671 Peroneal tendinitis, right leg: Secondary | ICD-10-CM | POA: Diagnosis not present

## 2015-10-16 DIAGNOSIS — M24574 Contracture, right foot: Secondary | ICD-10-CM | POA: Diagnosis not present

## 2015-10-16 DIAGNOSIS — Z952 Presence of prosthetic heart valve: Secondary | ICD-10-CM | POA: Diagnosis not present

## 2015-10-16 DIAGNOSIS — M24571 Contracture, right ankle: Secondary | ICD-10-CM | POA: Diagnosis not present

## 2015-11-13 DIAGNOSIS — Z952 Presence of prosthetic heart valve: Secondary | ICD-10-CM | POA: Diagnosis not present

## 2015-11-13 DIAGNOSIS — Z9889 Other specified postprocedural states: Secondary | ICD-10-CM | POA: Diagnosis not present

## 2015-11-13 DIAGNOSIS — Z7901 Long term (current) use of anticoagulants: Secondary | ICD-10-CM | POA: Diagnosis not present

## 2015-11-17 DIAGNOSIS — Z7901 Long term (current) use of anticoagulants: Secondary | ICD-10-CM | POA: Diagnosis not present

## 2015-11-20 DIAGNOSIS — Z7901 Long term (current) use of anticoagulants: Secondary | ICD-10-CM | POA: Diagnosis not present

## 2015-11-20 DIAGNOSIS — Z9889 Other specified postprocedural states: Secondary | ICD-10-CM | POA: Diagnosis not present

## 2015-11-25 DIAGNOSIS — Z952 Presence of prosthetic heart valve: Secondary | ICD-10-CM | POA: Diagnosis not present

## 2015-11-28 DIAGNOSIS — Z9889 Other specified postprocedural states: Secondary | ICD-10-CM | POA: Diagnosis not present

## 2015-11-28 DIAGNOSIS — Z7901 Long term (current) use of anticoagulants: Secondary | ICD-10-CM | POA: Diagnosis not present

## 2015-12-16 DIAGNOSIS — Z9889 Other specified postprocedural states: Secondary | ICD-10-CM | POA: Diagnosis not present

## 2015-12-16 DIAGNOSIS — R972 Elevated prostate specific antigen [PSA]: Secondary | ICD-10-CM | POA: Diagnosis not present

## 2015-12-16 DIAGNOSIS — E785 Hyperlipidemia, unspecified: Secondary | ICD-10-CM | POA: Diagnosis not present

## 2015-12-23 DIAGNOSIS — Z952 Presence of prosthetic heart valve: Secondary | ICD-10-CM | POA: Diagnosis not present

## 2016-01-21 DIAGNOSIS — E785 Hyperlipidemia, unspecified: Secondary | ICD-10-CM | POA: Diagnosis not present

## 2016-01-21 DIAGNOSIS — Z7901 Long term (current) use of anticoagulants: Secondary | ICD-10-CM | POA: Diagnosis not present

## 2016-01-21 DIAGNOSIS — Z954 Presence of other heart-valve replacement: Secondary | ICD-10-CM | POA: Diagnosis not present

## 2016-01-22 DIAGNOSIS — Z952 Presence of prosthetic heart valve: Secondary | ICD-10-CM | POA: Diagnosis not present

## 2016-01-29 DIAGNOSIS — Z7901 Long term (current) use of anticoagulants: Secondary | ICD-10-CM | POA: Diagnosis not present

## 2016-01-29 DIAGNOSIS — Z6824 Body mass index (BMI) 24.0-24.9, adult: Secondary | ICD-10-CM | POA: Diagnosis not present

## 2016-01-30 DIAGNOSIS — Z23 Encounter for immunization: Secondary | ICD-10-CM | POA: Diagnosis not present

## 2016-01-30 DIAGNOSIS — Z954 Presence of other heart-valve replacement: Secondary | ICD-10-CM | POA: Diagnosis not present

## 2016-01-30 DIAGNOSIS — Z7901 Long term (current) use of anticoagulants: Secondary | ICD-10-CM | POA: Diagnosis not present

## 2016-01-30 DIAGNOSIS — Z9889 Other specified postprocedural states: Secondary | ICD-10-CM | POA: Diagnosis not present

## 2016-02-05 DIAGNOSIS — Z7901 Long term (current) use of anticoagulants: Secondary | ICD-10-CM | POA: Diagnosis not present

## 2016-02-05 DIAGNOSIS — Z9889 Other specified postprocedural states: Secondary | ICD-10-CM | POA: Diagnosis not present

## 2016-02-12 DIAGNOSIS — Z9889 Other specified postprocedural states: Secondary | ICD-10-CM | POA: Diagnosis not present

## 2016-02-12 DIAGNOSIS — Z7901 Long term (current) use of anticoagulants: Secondary | ICD-10-CM | POA: Diagnosis not present

## 2016-02-24 ENCOUNTER — Other Ambulatory Visit: Payer: Self-pay | Admitting: Neurology

## 2016-02-24 DIAGNOSIS — R413 Other amnesia: Secondary | ICD-10-CM

## 2016-02-24 DIAGNOSIS — G3184 Mild cognitive impairment, so stated: Secondary | ICD-10-CM

## 2016-02-26 DIAGNOSIS — Z7901 Long term (current) use of anticoagulants: Secondary | ICD-10-CM | POA: Diagnosis not present

## 2016-02-26 DIAGNOSIS — Z954 Presence of other heart-valve replacement: Secondary | ICD-10-CM | POA: Diagnosis not present

## 2016-02-26 DIAGNOSIS — Z9889 Other specified postprocedural states: Secondary | ICD-10-CM | POA: Diagnosis not present

## 2016-03-11 DIAGNOSIS — Z7901 Long term (current) use of anticoagulants: Secondary | ICD-10-CM | POA: Diagnosis not present

## 2016-03-25 DIAGNOSIS — Z9889 Other specified postprocedural states: Secondary | ICD-10-CM | POA: Diagnosis not present

## 2016-03-25 DIAGNOSIS — Z7901 Long term (current) use of anticoagulants: Secondary | ICD-10-CM | POA: Diagnosis not present

## 2016-03-26 ENCOUNTER — Ambulatory Visit (INDEPENDENT_AMBULATORY_CARE_PROVIDER_SITE_OTHER): Payer: Medicare Other | Admitting: Nurse Practitioner

## 2016-03-26 ENCOUNTER — Encounter: Payer: Self-pay | Admitting: Nurse Practitioner

## 2016-03-26 VITALS — BP 118/65 | HR 57 | Ht 65.0 in | Wt 143.8 lb

## 2016-03-26 DIAGNOSIS — G3184 Mild cognitive impairment, so stated: Secondary | ICD-10-CM | POA: Diagnosis not present

## 2016-03-26 DIAGNOSIS — G309 Alzheimer's disease, unspecified: Secondary | ICD-10-CM | POA: Diagnosis not present

## 2016-03-26 DIAGNOSIS — R413 Other amnesia: Secondary | ICD-10-CM | POA: Diagnosis not present

## 2016-03-26 DIAGNOSIS — F0280 Dementia in other diseases classified elsewhere without behavioral disturbance: Secondary | ICD-10-CM | POA: Diagnosis not present

## 2016-03-26 MED ORDER — DONEPEZIL HCL 5 MG PO TABS: 5.0000 mg | ORAL_TABLET | Freq: Every day | ORAL | 3 refills | Status: DC

## 2016-03-26 NOTE — Patient Instructions (Signed)
Continue Namenda at current dose Will add Aricept 5 mg by mouth daily Follow-up in 3 months, next with Dr. Pearlean BrownieSethi

## 2016-03-26 NOTE — Progress Notes (Signed)
I agree with the above plan 

## 2016-03-26 NOTE — Progress Notes (Signed)
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 The family has noticed progressive memory loss and mild cognitive difficulties over the last 1 year stroke. He has been increasingly forgetful in locking the house or turning of the lights when he goes out. He also has trouble remembering names of people but he can remember this after a while. He does also have mild anxiety which seems to have increased recently but this is not any medications for that.He has bilateral hearing loss since last 5 years following complications of gentamicin usage for treatment for mitral valve endocarditis with prolonged antibiotics 5 years ago. He underwent mitral valve replacement at that time and has been on warfarin since then with a relatively stable INR. He denies any history of strokes, TIAs, speech problems, gait or balance problems. There have been no significant behavioral issues, hallucinations, delusions, significant headache noted. His gait and balance seem fine and there is no fall or safety risks identified. There is no prior history of seizures, significant head injury or loss of consciousness. Is no family history of dementia. He has not had any evaluation for treatable causes of memory loss or brain imaging done recently.he remains independent with activities of daily living and is able to take care of his own needs.  Update 1/8/2016PS : He returns for follow-up today after his last visit 3 months ago accompanied by his son. He has noticed improvement in his memory as well as concentration and cognitive difficulties. He has been taking Cerefolin and reservetrol every day . He also participates in cognitively challenging activities. He recently traveled to [place] for a month and  had slight difficulties in coping. He forgot his INR machine. He remains independent with activities of daily living and does not need help with any activities. He has no neuro neurological complaints. He underwent lab work for treatable causes of cognitive impairment which was negative. EEG showed no seizure activity. MRI scan of the brain personally reviewed shows mild changes of chronic microvascular ischemia and microhemorrhages  Update 11/22/2014 PS: He returns for follow-up after last visit 6 months ago. Is accompanied by his wife and son who contributed to the history. They have noticed cognitive decline since the last visit With the patient having good days and bad days and being confused and disoriented particularly when he has to learn some new activity in task. At times he is had trouble recognizing familiar faces on one occasion he had trouble coming up with his daughter's name. He was seen by his primary physician recently who gave him a sample pack of Namenda to try but he has not started that yet. He denies any fall, injury, focal symptoms suggestive of a stroke in the form of speech difficulties extremity weakness numbness gait or balance problems. He remains on warfarin which is tolerating well with INR being fairly stable. Update 12/20/2014 PS: He returns for follow-up after last visit 1 month ago. He initially reported some side effects on Namenda in the form of cough and feeling strange but that seems to have settled down. He was having some bad dreams initially and that also seems to be improving. He feels overall he is quite well during the day with better memory and concentration. However at nights he is restless and  often wakes up early in the morning. He does Nap for 10-15 minutes during the daytime but does manage to sleep 5-6 hours every night. He states his INR has been quite stable and he is on a fairly regular dose of warfarin. On Mini-Mental status exam today scored 27/30 which is  much improved from 22/30 at last visit.  11/9/2016PS : He returns for follow-up to last visit 3 months ago. Is accompanied by his wife. Patient continues to have noticed improvement in his memory and cognition after taking Namenda XR 28 mg daily. He keeps himself busy by solving crossword puzzles, doing sudoku and computer brain games. He had recent eye surgery for blocked lacrimal ducts which went well. He does have some bruises from the surgery on his periorbital region. The patient has several questions about his driving as well as ability to travel alone to visit his [place] in [place]. On Mini-Mental status exam today scored 25/30 which is not significantly changed from 27/30 at last visit. He was able to name 14 four legged animals and clock drawing score was 3/4.He is sleeping better. And has no complaints UPDATE 05/10/2017CM Patient returns for six-month follow-up with his wife for memory loss. He and his wife both think his memory is about the same. He is taking Namenda XR 28 mg daily. He continues to do games on the computer. He continues to drive short distances in familiar places without getting loss. He has no new complaints.  UPDATE 11/10/17CM Tanner Chambers, 80 year old male returns for follow-up with his wife. He has a history of memory loss. The patient thinks his memory is about the same, the wife thinks he is more forgetful. His score is less than it was at his last visit. He is currently on Namenda XR 28  mg daily. He has never been on Aricept. He continues to drive short distances and familiar places without getting lost however the wife tells me today  sometimes she is afraid to ride with him. He has also had a sporadic hallucination at night several times since last seen. It sounds more like shadows in the dark to me on description. No other medical issues since last seen. He has significant hearing loss. He returns for reevaluation.   REVIEW OF SYSTEMS: Full 14 system review of systems  performed and notable only for those listed, all others are neg:  Constitutional: neg  Cardiovascular: neg Ear/Nose/Throat: Hearing loss Skin: neg Eyes: neg Respiratory: neg Gastroitestinal: Urinary urgency  Hematology/Lymphatic: neg  Endocrine: neg Musculoskeletal:neg Allergy/Immunology: neg Neurological: Memory loss Psychiatric: neg Sleep : neg   ALLERGIES: Allergies  Allergen Reactions  . Ampicillin Other (See Comments)    Ask Patient  . Niacin Other (See Comments)    Gout  . Penicillins     HOME MEDICATIONS: Outpatient Medications Prior to Visit  Medication Sig Dispense Refill  . allopurinol (ZYLOPRIM) 300 MG tablet Take 300 mg by mouth daily. Taking 1/2 tablet daily    . atorvastatin (LIPITOR) 80 MG tablet Take 80 mg by mouth daily.    . cetirizine (ZYRTEC) 5 MG tablet Take 5 mg by mouth daily.  0  . chlorhexidine (PERIDEX) 0.12 % solution daily. as directed  99  . enoxaparin (LOVENOX) 60 MG/0.6ML injection     . ezetimibe (ZETIA) 10 MG tablet Take 10 mg by mouth daily.    Marland Kitchen glucosamine-chondroitin (GLUCOSAMINE-CHONDROITIN DS) 500-400 MG tablet Take by mouth.    . memantine (NAMENDA XR) 28 MG CP24 24 hr capsule  Take 1 capsule (28 mg total) by mouth daily. 90 capsule 1  . Methylfol-Methylcob-Acetylcyst (CEREFOLIN NAC) 6-2-600 MG TABS TAKE ONE TABLET BY MOUTH ONE TIME DAILY 90 each 0  . metoprolol (LOPRESSOR) 50 MG tablet     . metoprolol succinate (TOPROL-XL) 25 MG 24 hr tablet Take 25 mg by mouth daily.    Marland Kitchen. neomycin-polymyxin b-dexamethasone (MAXITROL) 3.5-10000-0.1 SUSP INSTILL ONE DROP IN EACH EYE FOUR TIMES DAILY  0  . warfarin (COUMADIN) 6 MG tablet Take 6 mg by mouth daily.    . Methylfol-Algae-B12-Acetylcyst (CEREFOLIN NAC) 6-90.314-2-600 MG TABS Take one tablet by mouth one time daily 90 tablet 4   No facility-administered medications prior to visit.     PAST MEDICAL HISTORY: Past Medical History:  Diagnosis Date  . Arthritis   . Coronary artery  disease   . Hyperlipemia   . Hypertension   . Memory loss     PAST SURGICAL HISTORY: Past Surgical History:  Procedure Laterality Date  . CORONARY ARTERY BYPASS GRAFT    . EYE SURGERY     both cataracts  . MITRAL VALVE SURGERY    . PROSTATE BIOPSY  2002  . SHOULDER ARTHROSCOPY     right  . SHOULDER ARTHROSCOPY W/ ROTATOR CUFF REPAIR  2002   left-dsc  . TONSILLECTOMY    . TRIGGER FINGER RELEASE Left 11/07/2012   Procedure: RELEASE TRIGGER FINGER/A-1 PULLEY LEFT RING;  Surgeon: Wyn Forsterobert V Sypher Jr., MD;  Location: [facility];  Service: Orthopedics;  Laterality: Left;    FAMILY HISTORY: No family history on file.  SOCIAL HISTORY: Social History   Social History  . Marital status: Married    Spouse name: N/A  . Number of children: 3  . Years of education: MD   Occupational History  . Not on file.   Social History Main Topics  . Smoking status: Never Smoker  . Smokeless tobacco: Never Used  . Alcohol use 0.0 oz/week     Comment: occ  . Drug use:     Types: Hydrocodone  . Sexual activity: Not on file   Other Topics Concern  . Not on file   Social History Narrative   Patient is married with 3 children.   Patient is right handed.   Patient is a ProofreaderMedical Doctor.   Patient drinks 2 cups daily.     PHYSICAL EXAM  Vitals:   03/26/16 1012  BP: 118/65  Pulse: (!) 57  Weight: 143 lb 12.8 oz (65.2 kg)  Height: 5\' 5"  (1.651 m)   Body mass index is 23.93 kg/m. General: well developed, well nourished, elderly  male seated, in no evident distress Head: head normocephalic and atraumatic.  Neck: supple with no carotid  bruits Cardiovascular: regular rate and rhythm, no murmurs Musculoskeletal: no deformity Skin: no rash/petichiae.  Vascular: Normal pulses all extremities  Neurological examination   Mentation:Awake and  alert. Marland Kitchen.  MMSE - Mini Mental State Exam 03/26/2016 09/24/2015 05/24/2014  Orientation to time 5 4 5   Orientation to Place 3 5 5    Registration 3 3 3   Attention/ Calculation 2 2 5   Recall 0 2 2  Language- name 2 objects 2 2 2   Language- repeat 0 0 1  Language- follow 3 step command 3 3 3   Language- read & follow direction 1 0 1  Write a sentence 1 0 1  Copy design 0 0 1  Total score 20 21 29      Animal fluency test 6. Clock  drawing 4/4.  Cranial Nerves:  Pupils equal, briskly reactive to light. Extraocular movements full without nystagmus. Visual fields full to confrontation. Hearing reduced bilaterallyt. Facial sensation intact. Face, tongue, palate moves normally and symmetrically.  Motor: Normal bulk and tone. Normal strength in all tested extremity muscles. Sensory.: intact to touch , pinprick , position and vibratory sensation.  Coordination: Rapid alternating movements normal in all extremities. Finger-to-nose and heel-to-shin performed accurately bilaterally. Gait and Station: Arises from chair without difficulty. Stance is normal. Gait demonstrates normal stride length and balance . Unable to heel, toe and tandem walk without difficulty. No assistive device Reflexes: 1+ and symmetric. Toes downgoing.    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   PLAN: Continue Namenda at current dose Will add Aricept 5 mg by mouth daily RX to wife Continue to participate in cognitively challenging activities like solving crossword puzzles, sudoku, playing bridge.  I would recommend that you do not drive.  Pt and wife not interested in research study for memory Follow-up in 3 months, next with Dr. Caswell CorwinSethi Shakhia Gramajo Darrol Angelarolyn Rohil Lesch, [facility]NP, [facility], APRN  [facility] [address], [address] [place], [place] [address] [phone]

## 2016-04-05 ENCOUNTER — Other Ambulatory Visit: Payer: Self-pay | Admitting: *Deleted

## 2016-04-05 MED ORDER — DONEPEZIL HCL 5 MG PO TABS: 5.0000 mg | ORAL_TABLET | Freq: Every day | ORAL | 1 refills | Status: DC

## 2016-04-05 MED ORDER — DONEPEZIL HCL 5 MG PO TABS
5.0000 mg | ORAL_TABLET | Freq: Every day | ORAL | 1 refills | Status: DC
Start: 2016-04-05 — End: 2016-04-05

## 2016-04-05 MED ORDER — MEMANTINE HCL ER 28 MG PO CP24: 28.0000 mg | ORAL_CAPSULE | Freq: Every day | ORAL | 1 refills | Status: DC

## 2016-04-06 DIAGNOSIS — Z7901 Long term (current) use of anticoagulants: Secondary | ICD-10-CM | POA: Diagnosis not present

## 2016-04-06 DIAGNOSIS — Z9889 Other specified postprocedural states: Secondary | ICD-10-CM | POA: Diagnosis not present

## 2016-04-20 DIAGNOSIS — Z9889 Other specified postprocedural states: Secondary | ICD-10-CM | POA: Diagnosis not present

## 2016-04-20 DIAGNOSIS — Z7901 Long term (current) use of anticoagulants: Secondary | ICD-10-CM | POA: Diagnosis not present

## 2016-05-05 DIAGNOSIS — Z7901 Long term (current) use of anticoagulants: Secondary | ICD-10-CM | POA: Diagnosis not present

## 2016-05-05 DIAGNOSIS — Z9889 Other specified postprocedural states: Secondary | ICD-10-CM | POA: Diagnosis not present

## 2016-05-14 ENCOUNTER — Other Ambulatory Visit: Payer: Self-pay | Admitting: Neurology

## 2016-05-14 DIAGNOSIS — G3184 Mild cognitive impairment, so stated: Secondary | ICD-10-CM

## 2016-05-14 DIAGNOSIS — R413 Other amnesia: Secondary | ICD-10-CM

## 2016-05-19 ENCOUNTER — Other Ambulatory Visit: Payer: Self-pay

## 2016-05-19 DIAGNOSIS — Z7901 Long term (current) use of anticoagulants: Secondary | ICD-10-CM | POA: Diagnosis not present

## 2016-05-19 DIAGNOSIS — Z9889 Other specified postprocedural states: Secondary | ICD-10-CM | POA: Diagnosis not present

## 2016-05-19 DIAGNOSIS — R413 Other amnesia: Secondary | ICD-10-CM

## 2016-05-19 DIAGNOSIS — G3184 Mild cognitive impairment, so stated: Secondary | ICD-10-CM

## 2016-05-19 MED ORDER — CEREFOLIN NAC 6-2-600 MG PO TABS
1.0000 | ORAL_TABLET | Freq: Every day | ORAL | 0 refills | Status: DC
Start: 2016-05-19 — End: 2016-06-09

## 2016-05-24 ENCOUNTER — Other Ambulatory Visit: Payer: Self-pay | Admitting: Neurology

## 2016-05-24 DIAGNOSIS — G3184 Mild cognitive impairment, so stated: Secondary | ICD-10-CM

## 2016-05-24 DIAGNOSIS — R413 Other amnesia: Secondary | ICD-10-CM

## 2016-05-25 DIAGNOSIS — M6701 Short Achilles tendon (acquired), right ankle: Secondary | ICD-10-CM | POA: Diagnosis not present

## 2016-05-25 DIAGNOSIS — M7751 Other enthesopathy of right foot: Secondary | ICD-10-CM | POA: Diagnosis not present

## 2016-05-27 DIAGNOSIS — Z954 Presence of other heart-valve replacement: Secondary | ICD-10-CM | POA: Diagnosis not present

## 2016-05-27 DIAGNOSIS — Z7901 Long term (current) use of anticoagulants: Secondary | ICD-10-CM | POA: Diagnosis not present

## 2016-06-01 DIAGNOSIS — Z954 Presence of other heart-valve replacement: Secondary | ICD-10-CM | POA: Diagnosis not present

## 2016-06-01 DIAGNOSIS — E785 Hyperlipidemia, unspecified: Secondary | ICD-10-CM | POA: Diagnosis not present

## 2016-06-01 DIAGNOSIS — Z7901 Long term (current) use of anticoagulants: Secondary | ICD-10-CM | POA: Diagnosis not present

## 2016-06-07 DIAGNOSIS — R9431 Abnormal electrocardiogram [ECG] [EKG]: Secondary | ICD-10-CM | POA: Diagnosis not present

## 2016-06-09 ENCOUNTER — Telehealth: Payer: Self-pay | Admitting: Neurology

## 2016-06-09 ENCOUNTER — Other Ambulatory Visit: Payer: Self-pay

## 2016-06-09 DIAGNOSIS — R413 Other amnesia: Secondary | ICD-10-CM

## 2016-06-09 DIAGNOSIS — G3184 Mild cognitive impairment, so stated: Secondary | ICD-10-CM

## 2016-06-09 MED ORDER — CEREFOLIN NAC 6-2-600 MG PO TABS: 1.0000 | ORAL_TABLET | Freq: Every day | ORAL | 2 refills | Status: DC

## 2016-06-09 NOTE — Telephone Encounter (Signed)
Rn call patient back regarding his refill for cerefolin. Pt stated he has been out for 3 days. PT did not call the office until today to request refills. Rn ask patient did he contact [facility] who distributes the medication thru the mail. The patient stated he did not call [facility] and did not have their phone number. RN gave pt the customer service number for [facility] to make sure the price would be the same. Pt ask the nurse about the price of cerefolin. Rn advise the patient to call [facility] pharmacy to get price information. Rn offer to send the rx locally but pt decline at this time. Rn did refill for patient to [facility]. Rn stated he can call [facility] to try to expedite the medication. Pt verbalized understanding.

## 2016-06-09 NOTE — Telephone Encounter (Signed)
Patient called in reference to cerefolin NAC patient states he has not received the medication in the mail and is out of his medication.  Please call

## 2016-06-10 DIAGNOSIS — Z954 Presence of other heart-valve replacement: Secondary | ICD-10-CM | POA: Diagnosis not present

## 2016-06-10 DIAGNOSIS — Z7901 Long term (current) use of anticoagulants: Secondary | ICD-10-CM | POA: Diagnosis not present

## 2016-06-14 DIAGNOSIS — Z7901 Long term (current) use of anticoagulants: Secondary | ICD-10-CM | POA: Diagnosis not present

## 2016-06-14 DIAGNOSIS — Z954 Presence of other heart-valve replacement: Secondary | ICD-10-CM | POA: Diagnosis not present

## 2016-06-16 DIAGNOSIS — Z7901 Long term (current) use of anticoagulants: Secondary | ICD-10-CM | POA: Diagnosis not present

## 2016-06-16 DIAGNOSIS — Z954 Presence of other heart-valve replacement: Secondary | ICD-10-CM | POA: Diagnosis not present

## 2016-06-22 DIAGNOSIS — H04123 Dry eye syndrome of bilateral lacrimal glands: Secondary | ICD-10-CM | POA: Diagnosis not present

## 2016-06-30 ENCOUNTER — Encounter: Payer: Self-pay | Admitting: Neurology

## 2016-06-30 ENCOUNTER — Telehealth: Payer: Self-pay

## 2016-06-30 ENCOUNTER — Ambulatory Visit (INDEPENDENT_AMBULATORY_CARE_PROVIDER_SITE_OTHER): Payer: Medicare Other | Admitting: Neurology

## 2016-06-30 VITALS — BP 150/69 | HR 56 | Ht 63.0 in | Wt 139.4 lb

## 2016-06-30 DIAGNOSIS — Z954 Presence of other heart-valve replacement: Secondary | ICD-10-CM | POA: Diagnosis not present

## 2016-06-30 DIAGNOSIS — F0150 Vascular dementia without behavioral disturbance: Secondary | ICD-10-CM

## 2016-06-30 DIAGNOSIS — Z7901 Long term (current) use of anticoagulants: Secondary | ICD-10-CM | POA: Diagnosis not present

## 2016-06-30 MED ORDER — DONEPEZIL HCL 5 MG PO TABS: 10.0000 mg | ORAL_TABLET | Freq: Every day | ORAL | 1 refills | Status: DC

## 2016-06-30 MED ORDER — MEMANTINE HCL-DONEPEZIL HCL ER 28-10 MG PO CP24: 1.0000 | ORAL_CAPSULE | Freq: Every day | ORAL | 3 refills | Status: DC

## 2016-06-30 NOTE — Telephone Encounter (Signed)
Pt no-showed his appt this morning. 

## 2016-06-30 NOTE — Patient Instructions (Signed)
I had a long discussion with Dr. [name] and Tanner Chambers regarding Tanner Chambers which has shown some response to Tanner Chambers and is tolerating 5 mg daily quite well. I recommend increasing Tanner Chambers dose to 10 mg daily and if tolerated when he finishes Tanner current tablets recommend he switch to Tanner Chambers 28/10 mg 1 tablet daily and stop taking Tanner Chambers and Tanner Chambers as separate tablets. I also strongly encourage him to give up driving but he is reluctant. I recommend these be referred to driver rehabilitation clinic in [place] to get real time  driving assessment. He will return for follow-up in 3 months or call earlier if necessary

## 2016-06-30 NOTE — Progress Notes (Signed)
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 The family has noticed progressive memory loss and mild cognitive difficulties over the last 1 year stroke. He has been increasingly forgetful in locking the house or turning of the lights when he goes out. He also has trouble remembering names of people but he can remember this after a while. He does also have mild anxiety which seems to have increased recently but this is not any medications for that.He has bilateral hearing loss since last 5 years following complications of gentamicin usage for treatment for mitral valve endocarditis with prolonged antibiotics 5 years ago. He underwent mitral valve replacement at that time and has been on warfarin since then with a relatively stable INR. He denies any history of strokes, TIAs, speech problems, gait or balance problems. There have been no significant behavioral issues, hallucinations, delusions, significant headache noted. His gait and balance seem fine and there is no fall or safety risks identified. There is no prior history of seizures, significant head injury or loss of consciousness. Is no family history of dementia. He has not had any evaluation for treatable causes of memory loss or brain imaging done recently.he remains independent with activities of daily living and is able to take care of his own needs.  Update 1/8/2016PS : He returns for follow-up today after his last visit 3 months ago accompanied by his son. He has noticed improvement in his memory as well as concentration and cognitive difficulties. He has been taking Cerefolin and reservetrol every day . He also participates in cognitively challenging activities. He recently traveled to [place] for a month and  had slight difficulties in coping. He forgot his INR machine. He remains independent with activities of daily living and does not need help with any activities. He has no neuro neurological complaints. He underwent lab work for treatable causes of cognitive impairment which was negative. EEG showed no seizure activity. MRI scan of the brain personally reviewed shows mild changes of chronic microvascular ischemia and microhemorrhages  Update 11/22/2014 PS: He returns for follow-up after last visit 6 months ago. Is accompanied by his wife and son who contributed to the history. They have noticed cognitive decline since the last visit With the patient having good days and bad days and being confused and disoriented particularly when he has to learn some new activity in task. At times he is had trouble recognizing familiar faces on one occasion he had trouble coming up with his daughter's name. He was seen by his primary physician recently who gave him a sample pack of Namenda to try but he has not started that yet. He denies any fall, injury, focal symptoms suggestive of a stroke in the form of speech difficulties extremity weakness numbness gait or balance problems. He remains on warfarin which is tolerating well with INR being fairly stable. Update 12/20/2014 PS: He returns for follow-up after last visit 1 month ago. He initially reported some side effects on Namenda in the form of cough and feeling strange but that seems to have settled down. He was having some bad dreams initially and that also seems to be improving. He feels overall he is quite well during the day with better memory and concentration. However at nights he is restless and  often wakes up early in the morning. He does Nap for 10-15 minutes during the daytime but does manage to sleep 5-6 hours every night. He states his INR has been quite stable and he is on a fairly regular dose of warfarin. On Mini-Mental status exam today scored 27/30 which is  much improved from 22/30 at last visit.  11/9/2016PS : He returns for follow-up to last visit 3 months ago. Is accompanied by his wife. Patient continues to have noticed improvement in his memory and cognition after taking Namenda XR 28 mg daily. He keeps himself busy by solving crossword puzzles, doing sudoku and computer brain games. He had recent eye surgery for blocked lacrimal ducts which went well. He does have some bruises from the surgery on his periorbital region. The patient has several questions about his driving as well as ability to travel alone to visit his son in [place]. On Mini-Mental status exam today scored 25/30 which is not significantly changed from 27/30 at last visit. He was able to name 14 four legged animals and clock drawing score was 3/4.He is sleeping better. And has no complaints UPDATE 05/10/2017CM Patient returns for six-month follow-up with his wife for memory loss. He and his wife both think his memory is about the same. He is taking Namenda XR 28 mg daily. He continues to do games on the computer. He continues to drive short distances in familiar places without getting loss. He has no new complaints.  UPDATE 11/10/17CM Tanner Chambers, 81 year old male returns for follow-up with his wife. He has a history of memory loss. The patient thinks his memory is about the same, the wife thinks he is more forgetful. His score is less than it was at his last visit. He is currently on Namenda XR 28  mg daily. He has never been on Aricept. He continues to drive short distances and familiar places without getting lost however the wife tells me today  sometimes she is afraid to ride with him. He has also had a sporadic hallucination at night several times since last seen. It sounds more like shadows in the dark to me on description. No other medical issues since last seen. He has significant hearing loss. He returns for reevaluation.  Update 06/30/2016 : He returns for follow-up after last  visit 3 months ago. He has tolerated Aricept 5 mg daily without any GI or CNS side effects. There has been some improvement in his memory and cognition but patient's wife states that particularly patient is unable to recognize only her. He has no trouble recognizing other family members and friends. Patient has still been driving but wife is concerned about his safety about doing so. Patient remains on Coumadin with INR being fairly stable. He has I  had no stroke or TIA symptoms. There have been no unsafe behavior or trouble with   agitation. There have been not many delusions or hallucinations. He has no trouble with walking and balance and there have been no falls REVIEW OF SYSTEMS: Full 14 system review of systems performed and notable only for those listed, all others are neg:  Memory loss, hearing loss, confusion and all other systems negative ALLERGIES: Allergies  Allergen Reactions  . Ampicillin Other (See Comments)    Ask Patient  . Niacin Other (See Comments)    Gout  . Penicillins     HOME MEDICATIONS: Outpatient Medications Prior to Visit  Medication Sig Dispense Refill  . allopurinol (ZYLOPRIM) 300 MG tablet Take 300  mg by mouth daily. Taking 1/2 tablet daily    . atorvastatin (LIPITOR) 80 MG tablet Take 80 mg by mouth daily.    . cetirizine (ZYRTEC) 5 MG tablet Take 5 mg by mouth daily.  0  . chlorhexidine (PERIDEX) 0.12 % solution daily. as directed  99  . enoxaparin (LOVENOX) 60 MG/0.6ML injection     . ezetimibe (ZETIA) 10 MG tablet Take 10 mg by mouth daily.    Marland Kitchen glucosamine-chondroitin (GLUCOSAMINE-CHONDROITIN DS) 500-400 MG tablet Take by mouth.    . memantine (NAMENDA XR) 28 MG CP24 24 hr capsule Take 1 capsule (28 mg total) by mouth daily. 90 capsule 1  . Methylfol-Methylcob-Acetylcyst (CEREFOLIN NAC) 6-2-600 MG TABS Take 1 tablet by mouth daily. 90 each 2  . metoprolol (LOPRESSOR) 50 MG tablet     . metoprolol succinate (TOPROL-XL) 25 MG 24 hr tablet Take 25 mg by  mouth daily.    Marland Kitchen neomycin-polymyxin b-dexamethasone (MAXITROL) 3.5-10000-0.1 SUSP INSTILL ONE DROP IN EACH EYE FOUR TIMES DAILY  0  . warfarin (COUMADIN) 6 MG tablet Take 6 mg by mouth daily.    Marland Kitchen donepezil (ARICEPT) 5 MG tablet Take 1 tablet (5 mg total) by mouth daily. 90 tablet 1   No facility-administered medications prior to visit.     PAST MEDICAL HISTORY: Past Medical History:  Diagnosis Date  . Arthritis   . Coronary artery disease   . Hyperlipemia   . Hypertension   . Memory loss     PAST SURGICAL HISTORY: Past Surgical History:  Procedure Laterality Date  . CORONARY ARTERY BYPASS GRAFT    . EYE SURGERY     both cataracts  . MITRAL VALVE SURGERY    . PROSTATE BIOPSY  2002  . SHOULDER ARTHROSCOPY     right  . SHOULDER ARTHROSCOPY W/ ROTATOR CUFF REPAIR  2002   left-dsc  . TONSILLECTOMY    . TRIGGER FINGER RELEASE Left 11/07/2012   Procedure: RELEASE TRIGGER FINGER/A-1 PULLEY LEFT RING;  Surgeon: Wyn Forster., MD;  Location: [facility];  Service: Orthopedics;  Laterality: Left;    FAMILY HISTORY: History reviewed. No pertinent family history.  SOCIAL HISTORY: Social History   Social History  . Marital status: Married    Spouse name: N/A  . Number of children: 3  . Years of education: MD   Occupational History  . Not on file.   Social History Main Topics  . Smoking status: Never Smoker  . Smokeless tobacco: Never Used  . Alcohol use 0.0 oz/week     Comment: occ  . Drug use: Yes    Types: Hydrocodone  . Sexual activity: Not on file   Other Topics Concern  . Not on file   Social History Narrative   Patient is married with 3 children.   Patient is right handed.   Patient is a Proofreader.   Patient drinks 2 cups daily.     PHYSICAL EXAM  Vitals:   06/30/16 1021  BP: (!) 150/69  Pulse: (!) 56  Weight: 139 lb 6.4 oz (63.2 kg)  Height: 5\' 3"  (1.6 m)   Body mass index is 24.69 kg/m. General: well developed, well  nourished, elderly  Asian [place] male seated, in no evident distress Head: head normocephalic and atraumatic.  Neck: supple with no carotid  bruits Cardiovascular: regular rate and rhythm, no murmurs Musculoskeletal: no deformity Skin: no rash/petichiae.  Vascular: Normal pulses all extremities  Neurological examination   Mentation:Awake and  alert. Marland Kitchen.  MMSE - Mini Mental State Exam 06/30/2016 03/26/2016 09/24/2015  Orientation to time 3 5 4   Orientation to Place 3 3 5   Registration 2 3 3   Attention/ Calculation 4 2 2   Recall 2 0 2  Language- name 2 objects 2 2 2   Language- repeat 0 0 0  Language- follow 3 step command 3 3 3   Language- read & follow direction 1 1 0  Write a sentence 1 1 0  Copy design 1 0 0  Total score 22 20 21      Animal fluency test 6. Clock drawing 4/4.  Cranial Nerves:  Pupils equal, briskly reactive to light. Extraocular movements full without nystagmus. Visual fields full to confrontation. Hearing reduced bilaterallyt. Facial sensation intact. Face, tongue, palate moves normally and symmetrically.  Motor: Normal bulk and tone. Normal strength in all tested extremity muscles. Sensory.: intact to touch , pinprick , position and vibratory sensation.  Coordination: Rapid alternating movements normal in all extremities. Finger-to-nose and heel-to-shin performed accurately bilaterally. Gait and Station: Arises from chair without difficulty. Stance is normal. Gait demonstrates normal stride length and balance . Unable to heel, toe and tandem walk without difficulty. No assistive device Reflexes: 1+ and symmetric. Toes downgoing.    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   PLAN:   I had a long discussion with Dr. [name] and his wife regarding his dementia which has shown some response  to Aricept and is tolerating 5 mg daily quite well. I recommend increasing Aricept dose to 10 mg daily and if tolerated when he finishes his current tablets recommend he switch to Namzaric 28/10 mg 1 tablet daily and stop taking Namenda and Aricept as separate tablets. I also strongly encourage him to give up driving but he is reluctant. I recommend these be referred to driver rehabilitation clinic in [place] to get real time  driving assessment. Greater than 50% time during this 25 minute visit was spent on counseling and coordination of care about his dementia and discussion of available medications, side effects and his driving ability and answering questions He will return for follow-up in 3 months or call earlier if necessary [facility] [address], [address] [place], [place] [address] [phone]

## 2016-07-14 DIAGNOSIS — Z7901 Long term (current) use of anticoagulants: Secondary | ICD-10-CM | POA: Diagnosis not present

## 2016-07-14 DIAGNOSIS — Z954 Presence of other heart-valve replacement: Secondary | ICD-10-CM | POA: Diagnosis not present

## 2016-07-14 DIAGNOSIS — E785 Hyperlipidemia, unspecified: Secondary | ICD-10-CM | POA: Diagnosis not present

## 2016-07-14 DIAGNOSIS — Z952 Presence of prosthetic heart valve: Secondary | ICD-10-CM | POA: Diagnosis not present

## 2016-07-14 DIAGNOSIS — E784 Other hyperlipidemia: Secondary | ICD-10-CM | POA: Diagnosis not present

## 2016-07-16 DIAGNOSIS — Z952 Presence of prosthetic heart valve: Secondary | ICD-10-CM | POA: Diagnosis not present

## 2016-07-16 DIAGNOSIS — Z7901 Long term (current) use of anticoagulants: Secondary | ICD-10-CM | POA: Diagnosis not present

## 2016-07-30 DIAGNOSIS — Z952 Presence of prosthetic heart valve: Secondary | ICD-10-CM | POA: Diagnosis not present

## 2016-07-30 DIAGNOSIS — Z7901 Long term (current) use of anticoagulants: Secondary | ICD-10-CM | POA: Diagnosis not present

## 2016-08-02 DIAGNOSIS — Z952 Presence of prosthetic heart valve: Secondary | ICD-10-CM | POA: Diagnosis not present

## 2016-08-02 DIAGNOSIS — Z7901 Long term (current) use of anticoagulants: Secondary | ICD-10-CM | POA: Diagnosis not present

## 2016-08-17 DIAGNOSIS — Z952 Presence of prosthetic heart valve: Secondary | ICD-10-CM | POA: Diagnosis not present

## 2016-08-17 DIAGNOSIS — Z9889 Other specified postprocedural states: Secondary | ICD-10-CM | POA: Diagnosis not present

## 2016-08-17 DIAGNOSIS — Z7901 Long term (current) use of anticoagulants: Secondary | ICD-10-CM | POA: Diagnosis not present

## 2016-08-30 DIAGNOSIS — K121 Other forms of stomatitis: Secondary | ICD-10-CM | POA: Diagnosis not present

## 2016-08-30 DIAGNOSIS — T50905A Adverse effect of unspecified drugs, medicaments and biological substances, initial encounter: Secondary | ICD-10-CM | POA: Diagnosis not present

## 2016-08-31 DIAGNOSIS — Z7901 Long term (current) use of anticoagulants: Secondary | ICD-10-CM | POA: Diagnosis not present

## 2016-08-31 DIAGNOSIS — Z9889 Other specified postprocedural states: Secondary | ICD-10-CM | POA: Diagnosis not present

## 2016-08-31 DIAGNOSIS — Z952 Presence of prosthetic heart valve: Secondary | ICD-10-CM | POA: Diagnosis not present

## 2016-08-31 DIAGNOSIS — R799 Abnormal finding of blood chemistry, unspecified: Secondary | ICD-10-CM | POA: Diagnosis not present

## 2016-09-14 DIAGNOSIS — Z952 Presence of prosthetic heart valve: Secondary | ICD-10-CM | POA: Diagnosis not present

## 2016-09-14 DIAGNOSIS — Z7901 Long term (current) use of anticoagulants: Secondary | ICD-10-CM | POA: Diagnosis not present

## 2016-09-23 ENCOUNTER — Ambulatory Visit: Payer: Self-pay | Admitting: Neurology

## 2016-09-28 DIAGNOSIS — R799 Abnormal finding of blood chemistry, unspecified: Secondary | ICD-10-CM | POA: Diagnosis not present

## 2016-09-28 DIAGNOSIS — Z9889 Other specified postprocedural states: Secondary | ICD-10-CM | POA: Diagnosis not present

## 2016-09-28 DIAGNOSIS — Z952 Presence of prosthetic heart valve: Secondary | ICD-10-CM | POA: Diagnosis not present

## 2016-09-28 DIAGNOSIS — Z7901 Long term (current) use of anticoagulants: Secondary | ICD-10-CM | POA: Diagnosis not present

## 2016-10-04 DIAGNOSIS — I2510 Atherosclerotic heart disease of native coronary artery without angina pectoris: Secondary | ICD-10-CM | POA: Diagnosis not present

## 2016-10-04 DIAGNOSIS — Z1389 Encounter for screening for other disorder: Secondary | ICD-10-CM | POA: Diagnosis not present

## 2016-10-04 DIAGNOSIS — Z6823 Body mass index (BMI) 23.0-23.9, adult: Secondary | ICD-10-CM | POA: Diagnosis not present

## 2016-10-04 DIAGNOSIS — E785 Hyperlipidemia, unspecified: Secondary | ICD-10-CM | POA: Diagnosis not present

## 2016-10-04 DIAGNOSIS — M109 Gout, unspecified: Secondary | ICD-10-CM | POA: Diagnosis not present

## 2016-10-04 DIAGNOSIS — Z125 Encounter for screening for malignant neoplasm of prostate: Secondary | ICD-10-CM | POA: Diagnosis not present

## 2016-10-04 DIAGNOSIS — Z23 Encounter for immunization: Secondary | ICD-10-CM | POA: Diagnosis not present

## 2016-10-04 DIAGNOSIS — Z9181 History of falling: Secondary | ICD-10-CM | POA: Diagnosis not present

## 2016-10-04 DIAGNOSIS — D696 Thrombocytopenia, unspecified: Secondary | ICD-10-CM | POA: Diagnosis not present

## 2016-10-04 DIAGNOSIS — Z0000 Encounter for general adult medical examination without abnormal findings: Secondary | ICD-10-CM | POA: Diagnosis not present

## 2016-10-06 ENCOUNTER — Ambulatory Visit: Payer: Medicare Other | Admitting: Neurology

## 2016-10-14 DIAGNOSIS — Z7901 Long term (current) use of anticoagulants: Secondary | ICD-10-CM | POA: Diagnosis not present

## 2016-10-14 DIAGNOSIS — Z952 Presence of prosthetic heart valve: Secondary | ICD-10-CM | POA: Diagnosis not present

## 2016-10-31 DIAGNOSIS — Z7901 Long term (current) use of anticoagulants: Secondary | ICD-10-CM | POA: Diagnosis not present

## 2016-10-31 DIAGNOSIS — Z952 Presence of prosthetic heart valve: Secondary | ICD-10-CM | POA: Diagnosis not present

## 2016-11-12 DIAGNOSIS — Z7901 Long term (current) use of anticoagulants: Secondary | ICD-10-CM | POA: Diagnosis not present

## 2016-11-12 DIAGNOSIS — Z952 Presence of prosthetic heart valve: Secondary | ICD-10-CM | POA: Diagnosis not present

## 2016-11-18 ENCOUNTER — Ambulatory Visit: Payer: Medicare Other | Admitting: Neurology

## 2016-11-26 DIAGNOSIS — Z9889 Other specified postprocedural states: Secondary | ICD-10-CM | POA: Diagnosis not present

## 2016-11-26 DIAGNOSIS — Z7901 Long term (current) use of anticoagulants: Secondary | ICD-10-CM | POA: Diagnosis not present

## 2016-12-10 DIAGNOSIS — Z7901 Long term (current) use of anticoagulants: Secondary | ICD-10-CM | POA: Diagnosis not present

## 2016-12-10 DIAGNOSIS — Z952 Presence of prosthetic heart valve: Secondary | ICD-10-CM | POA: Diagnosis not present

## 2016-12-15 ENCOUNTER — Ambulatory Visit (INDEPENDENT_AMBULATORY_CARE_PROVIDER_SITE_OTHER): Payer: Medicare Other | Admitting: Neurology

## 2016-12-15 ENCOUNTER — Encounter: Payer: Self-pay | Admitting: Neurology

## 2016-12-15 VITALS — BP 155/72 | HR 56 | Wt 137.8 lb

## 2016-12-15 DIAGNOSIS — F0280 Dementia in other diseases classified elsewhere without behavioral disturbance: Secondary | ICD-10-CM | POA: Diagnosis not present

## 2016-12-15 MED ORDER — MEMANTINE HCL-DONEPEZIL HCL ER 28-10 MG PO CP24: 1.0000 | ORAL_CAPSULE | Freq: Every day | ORAL | 3 refills | Status: DC

## 2016-12-15 NOTE — Progress Notes (Signed)
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 The family has noticed progressive memory loss and mild cognitive difficulties over the last 1 year stroke. He has been increasingly forgetful in locking the house or turning of the lights when he goes out. He also has trouble remembering names of people but he can remember this after a while. He does also have mild anxiety which seems to have increased recently but this is not any medications for that.He has bilateral hearing loss since last 5 years following complications of gentamicin usage for treatment for mitral valve endocarditis with prolonged antibiotics 5 years ago. He underwent mitral valve replacement at that time and has been on warfarin since then with a relatively stable INR. He denies any history of strokes, TIAs, speech problems, gait or balance problems. There have been no significant behavioral issues, hallucinations, delusions, significant headache noted. His gait and balance seem fine and there is no fall or safety risks identified. There is no prior history of seizures, significant head injury or loss of consciousness. Is no family history of dementia. He has not had any evaluation for treatable causes of memory loss or brain imaging done recently.he remains independent with activities of daily living and is able to take care of his own needs.  Update 1/8/2016PS : He returns for follow-up today after his last visit 3 months ago accompanied by his son. He has noticed improvement in his memory as well as concentration and cognitive difficulties. He has been taking Cerefolin and reservetrol every day . He also participates in cognitively challenging activities. He recently traveled to [place] for a month and  had slight difficulties in coping. He forgot his INR machine. He remains independent with activities of daily living and does not need help with any activities. He has no neuro neurological complaints. He underwent lab work for treatable causes of cognitive impairment which was negative. EEG showed no seizure activity. MRI scan of the brain personally reviewed shows mild changes of chronic microvascular ischemia and microhemorrhages  Update 11/22/2014 PS: He returns for follow-up after last visit 6 months ago. Is accompanied by his wife and son who contributed to the history. They have noticed cognitive decline since the last visit With the patient having good days and bad days and being confused and disoriented particularly when he has to learn some new activity in task. At times he is had trouble recognizing familiar faces on one occasion he had trouble coming up with his daughter's name. He was seen by his primary physician recently who gave him a sample pack of Namenda to try but he has not started that yet. He denies any fall, injury, focal symptoms suggestive of a stroke in the form of speech difficulties extremity weakness numbness gait or balance problems. He remains on warfarin which is tolerating well with INR being fairly stable. Update 12/20/2014 PS: He returns for follow-up after last visit 1 month ago. He initially reported some side effects on Namenda in the form of cough and feeling strange but that seems to have settled down. He was having some bad dreams initially and that also seems to be improving. He feels overall he is quite well during the day with better memory and concentration. However at nights he is restless and  often wakes up early in the morning. He does Nap for 10-15 minutes during the daytime but does manage to sleep 5-6 hours every night. He states his INR has been quite stable and he is on a fairly regular dose of warfarin. On Mini-Mental status exam today scored 27/30 which is  much improved from 22/30 at last visit.  11/9/2016PS : He returns for follow-up to last visit 3 months ago. Is accompanied by his wife. Patient continues to have noticed improvement in his memory and cognition after taking Namenda XR 28 mg daily. He keeps himself busy by solving crossword puzzles, doing sudoku and computer brain games. He had recent eye surgery for blocked lacrimal ducts which went well. He does have some bruises from the surgery on his periorbital region. The patient has several questions about his driving as well as ability to travel alone to visit his [place] in [place]. On Mini-Mental status exam today scored 25/30 which is not significantly changed from 27/30 at last visit. He was able to name 14 four legged animals and clock drawing score was 3/4.He is sleeping better. And has no complaints UPDATE 05/10/2017CM Patient returns for six-month follow-up with his wife for memory loss. He and his wife both think his memory is about the same. He is taking Namenda XR 28 mg daily. He continues to do games on the computer. He continues to drive short distances in familiar places without getting loss. He has no new complaints.  UPDATE 11/10/17CM Tanner Chambers, 81 year old male returns for follow-up with his wife. He has a history of memory loss. The patient thinks his memory is about the same, the wife thinks he is more forgetful. His score is less than it was at his last visit. He is currently on Namenda XR 28  mg daily. He has never been on Aricept. He continues to drive short distances and familiar places without getting lost however the wife tells me today  sometimes she is afraid to ride with him. He has also had a sporadic hallucination at night several times since last seen. It sounds more like shadows in the dark to me on description. No other medical issues since last seen. He has significant hearing loss. He returns for reevaluation.  Update 06/30/2016 : He returns for follow-up after last  visit 3 months ago. He has tolerated Aricept 5 mg daily without any GI or CNS side effects. There has been some improvement in his memory and cognition but patient's wife states that particularly patient is unable to recognize only her. He has no trouble recognizing other family members and friends. Patient has still been driving but wife is concerned about his safety about doing so. Patient remains on Coumadin with INR being fairly stable. He has I  had no stroke or TIA symptoms. There have been no unsafe behavior or trouble with   agitation. There have been not many delusions or hallucinations. He has no trouble with walking and balance and there have been no falls Update 12/15/2016 : He returns for follow-up after last visit 6 months ago. Is accompanied by his wife. He has been able to tolerate Namzaric well without any obvious side effects and in fact both of them feel there may be some cognitive stabilization. He is not able to recognize his wife mostly does get occasionally confused. He still needs close supervision. He is still driving but has agreed to drive on the with his wife next to him. He has had no episodes of  getting lost any agitation or violent behavior. There have been no hallucinations or delusions. He has unfortunately lost his hearing aid 2 days ago and has having trouble hearing. Despite this he did better on the Mini-Mental testing in office today compared to last visit. Patient in pounds with a few weeks ago he had minor fall when he hit the back of his head but did not lose consciousness. There was no bleeding or swelling noted in his scalp. On exam today there is no tenderness or bruises noted by me. He denies any headache, gait or balance difficulties or neurological change after that fall. He remains on warfarin which is tolerating well and his INR has been stable. REVIEW OF SYSTEMS: Full 14 system review of systems performed and notable only for those listed, all others are neg:    Memory loss, hearing loss, confusion and all other systems negative ALLERGIES: Allergies  Allergen Reactions  . Ampicillin Other (See Comments)    Ask Patient  . Niacin Other (See Comments)    Gout  . Penicillins     HOME MEDICATIONS: Outpatient Medications Prior to Visit  Medication Sig Dispense Refill  . allopurinol (ZYLOPRIM) 300 MG tablet Take 300 mg by mouth daily. Taking 1/2 tablet daily    . atorvastatin (LIPITOR) 80 MG tablet Take 80 mg by mouth daily.    . cetirizine (ZYRTEC) 5 MG tablet Take 5 mg by mouth daily.  0  . chlorhexidine (PERIDEX) 0.12 % solution daily. as directed  99  . ezetimibe (ZETIA) 10 MG tablet Take 10 mg by mouth daily.    Marland Kitchen. glucosamine-chondroitin (GLUCOSAMINE-CHONDROITIN DS) 500-400 MG tablet Take by mouth.    . Methylfol-Methylcob-Acetylcyst (CEREFOLIN NAC) 6-2-600 MG TABS Take 1 tablet by mouth daily. 90 each 2  . metoprolol (LOPRESSOR) 50 MG tablet     . neomycin-polymyxin b-dexamethasone (MAXITROL) 3.5-10000-0.1 SUSP INSTILL ONE DROP IN EACH EYE FOUR TIMES DAILY  0  . warfarin (COUMADIN) 6 MG tablet Take 6 mg by mouth daily.    . Memantine HCl-Donepezil HCl (NAMZARIC) 28-10 MG CP24 Take 1 tablet by mouth daily. 90 capsule 3  . donepezil (ARICEPT) 5 MG tablet Take 2 tablets (10 mg total) by mouth daily. (Patient not taking: Reported on 12/15/2016) 90 tablet 1  . enoxaparin (LOVENOX) 60 MG/0.6ML injection     . memantine (NAMENDA XR) 28 MG CP24 24 hr capsule Take 1 capsule (28 mg total) by mouth daily. (Patient not taking: Reported on 12/15/2016) 90 capsule 1  . metoprolol succinate (TOPROL-XL) 25 MG 24 hr tablet Take 25 mg by mouth daily.     No facility-administered medications prior to visit.     PAST MEDICAL HISTORY: Past Medical History:  Diagnosis Date  . Arthritis   . Coronary artery disease   . Hyperlipemia   . Hypertension   . Memory loss     PAST SURGICAL HISTORY: Past Surgical History:  Procedure Laterality Date  . CORONARY  ARTERY BYPASS GRAFT    . EYE SURGERY     both cataracts  . MITRAL VALVE SURGERY    . PROSTATE BIOPSY  2002  . SHOULDER ARTHROSCOPY     right  . SHOULDER ARTHROSCOPY W/ ROTATOR CUFF REPAIR  2002   left-dsc  . TONSILLECTOMY    . TRIGGER FINGER RELEASE Left 11/07/2012   Procedure: RELEASE TRIGGER FINGER/A-1 PULLEY LEFT RING;  Surgeon: Wyn Forsterobert V Sypher Jr., MD;  Location: [facility];  Service: Orthopedics;  Laterality: Left;  FAMILY HISTORY: History reviewed. No pertinent family history.  SOCIAL HISTORY: Social History   Social History  . Marital status: Married    Spouse name: N/A  . Number of children: 3  . Years of education: MD   Occupational History  . Not on file.   Social History Main Topics  . Smoking status: Never Smoker  . Smokeless tobacco: Never Used  . Alcohol use 0.0 oz/week     Comment: occ  . Drug use: Yes    Types: Hydrocodone  . Sexual activity: Not on file   Other Topics Concern  . Not on file   Social History Narrative   Patient is married with 3 children.   Patient is right handed.   Patient is a ProofreaderMedical Doctor.   Patient drinks 2 cups daily.     PHYSICAL EXAM  Vitals:   12/15/16 0832  BP: (!) 155/72  Pulse: (!) 56  Weight: 137 lb 12.8 oz (62.5 kg)   Body mass index is 24.41 kg/m. General: well developed, well nourished, elderly  Asian [place] male seated, in no evident distress Head: head normocephalic and atraumatic.  Neck: supple with no carotid  bruits Cardiovascular: regular rate and rhythm, no murmurs Musculoskeletal: no deformity Skin: no rash/petichiae.  Vascular: Normal pulses all extremities  Neurological examination   Mentation:Awake and  alert. Marland Kitchen.  MMSE - Mini Mental State Exam 12/15/2016 06/30/2016 03/26/2016  Orientation to time 4 3 5   Orientation to Place 5 3 3   Registration 3 2 3   Attention/ Calculation 0 4 2  Recall 3 2 0  Language- name 2 objects 2 2 2   Language- repeat 1 0 0  Language-  follow 3 step command 2 3 3   Language- read & follow direction 1 1 1   Write a sentence 1 1 1   Copy design 1 1 0  Total score 23 22 20      Animal fluency test 11. Clock drawing 3/4.  Cranial Nerves:  Pupils equal, briskly reactive to light. Extraocular movements full without nystagmus. Visual fields full to confrontation. Hearing reduced bilaterallyt. Facial sensation intact. Face, tongue, palate moves normally and symmetrically.  Motor: Normal bulk and tone. Normal strength in all tested extremity muscles. Sensory.: intact to touch , pinprick , position and vibratory sensation.  Coordination: Rapid alternating movements normal in all extremities. Finger-to-nose and heel-to-shin performed accurately bilaterally. Gait and Station: Arises from chair without difficulty. Stance is normal. Gait demonstrates normal stride length and balance . Unable to heel, toe and tandem walk without difficulty. No assistive device Reflexes: 1+ and symmetric. Toes downgoing.    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 Some stabilization on Namzaric  PLAN:   I had a long discussion with the patient and his wife regarding his mild dementia which appears to be fairly stable on the current medication regimen of Namzaric daily. Continue warfarin for his mechanical heart valve. He was advised to continue participation in mentally challenging activities. He was also advised to limit his driving to only when accompanied  by someone. He voiced understanding. He may also consider possible participation in the [facility] dementia trial if interested. He was given written information to take home and review with his family and decide. He will return for follow-up in 6 months or call earlier if necessaryGreater than 50% time during  this 35 minute visit  was spent on counseling and coordination of care about his dementia and discussion of available treatment options,availibility of clinical trials and his driving ability and answering questions He will return for follow-up in 3 months or call earlier if necessary Delia Heady, MD [facility] [address] [place], [place] [address] [phone]

## 2016-12-15 NOTE — Patient Instructions (Signed)
I had a long discussion with the patient and his wife regarding his mild dementia which appears to be fairly stable on the current medication regimen of Namzaric daily. Continue warfarin for his mechanical heart valve. He was advised to continue participation in mentally challenging activities. He was also advised to limit his driving to only when accompanied  by someone. He voiced understanding. He may also consider possible participation in the [facility] dementia trial if interested. He was given written information to take home and review with his family and decide. He will return for follow-up in 6 months or call earlier if necessary

## 2016-12-24 DIAGNOSIS — Z952 Presence of prosthetic heart valve: Secondary | ICD-10-CM | POA: Diagnosis not present

## 2016-12-24 DIAGNOSIS — Z7901 Long term (current) use of anticoagulants: Secondary | ICD-10-CM | POA: Diagnosis not present

## 2016-12-31 DIAGNOSIS — Z954 Presence of other heart-valve replacement: Secondary | ICD-10-CM | POA: Diagnosis not present

## 2016-12-31 DIAGNOSIS — Z952 Presence of prosthetic heart valve: Secondary | ICD-10-CM | POA: Diagnosis not present

## 2016-12-31 DIAGNOSIS — Z7901 Long term (current) use of anticoagulants: Secondary | ICD-10-CM | POA: Diagnosis not present

## 2017-01-19 DIAGNOSIS — Z7901 Long term (current) use of anticoagulants: Secondary | ICD-10-CM | POA: Diagnosis not present

## 2017-01-19 DIAGNOSIS — I059 Rheumatic mitral valve disease, unspecified: Secondary | ICD-10-CM | POA: Diagnosis not present

## 2017-02-02 DIAGNOSIS — J329 Chronic sinusitis, unspecified: Secondary | ICD-10-CM | POA: Diagnosis not present

## 2017-02-02 DIAGNOSIS — H9193 Unspecified hearing loss, bilateral: Secondary | ICD-10-CM | POA: Diagnosis not present

## 2017-02-02 DIAGNOSIS — J3489 Other specified disorders of nose and nasal sinuses: Secondary | ICD-10-CM | POA: Diagnosis not present

## 2017-02-02 DIAGNOSIS — J342 Deviated nasal septum: Secondary | ICD-10-CM | POA: Diagnosis not present

## 2017-02-02 DIAGNOSIS — J343 Hypertrophy of nasal turbinates: Secondary | ICD-10-CM | POA: Diagnosis not present

## 2017-02-02 DIAGNOSIS — R0981 Nasal congestion: Secondary | ICD-10-CM | POA: Diagnosis not present

## 2017-02-04 DIAGNOSIS — J329 Chronic sinusitis, unspecified: Secondary | ICD-10-CM | POA: Diagnosis not present

## 2017-02-08 DIAGNOSIS — J343 Hypertrophy of nasal turbinates: Secondary | ICD-10-CM | POA: Diagnosis not present

## 2017-02-08 DIAGNOSIS — J300 Vasomotor rhinitis: Secondary | ICD-10-CM | POA: Diagnosis not present

## 2017-02-08 DIAGNOSIS — J342 Deviated nasal septum: Secondary | ICD-10-CM | POA: Diagnosis not present

## 2017-02-09 DIAGNOSIS — I059 Rheumatic mitral valve disease, unspecified: Secondary | ICD-10-CM | POA: Diagnosis not present

## 2017-02-15 DIAGNOSIS — Z23 Encounter for immunization: Secondary | ICD-10-CM | POA: Diagnosis not present

## 2017-02-16 DIAGNOSIS — I059 Rheumatic mitral valve disease, unspecified: Secondary | ICD-10-CM | POA: Diagnosis not present

## 2017-03-02 DIAGNOSIS — I059 Rheumatic mitral valve disease, unspecified: Secondary | ICD-10-CM | POA: Diagnosis not present

## 2017-03-09 DIAGNOSIS — H26492 Other secondary cataract, left eye: Secondary | ICD-10-CM | POA: Diagnosis not present

## 2017-03-09 DIAGNOSIS — H26491 Other secondary cataract, right eye: Secondary | ICD-10-CM | POA: Diagnosis not present

## 2017-03-09 DIAGNOSIS — H04123 Dry eye syndrome of bilateral lacrimal glands: Secondary | ICD-10-CM | POA: Diagnosis not present

## 2017-03-15 DIAGNOSIS — H26491 Other secondary cataract, right eye: Secondary | ICD-10-CM | POA: Diagnosis not present

## 2017-03-16 DIAGNOSIS — I059 Rheumatic mitral valve disease, unspecified: Secondary | ICD-10-CM | POA: Diagnosis not present

## 2017-03-29 ENCOUNTER — Other Ambulatory Visit: Payer: Self-pay

## 2017-03-29 DIAGNOSIS — G3184 Mild cognitive impairment, so stated: Secondary | ICD-10-CM

## 2017-03-29 DIAGNOSIS — R413 Other amnesia: Secondary | ICD-10-CM

## 2017-03-29 MED ORDER — CEREFOLIN NAC 6-2-600 MG PO TABS: 1.0000 | ORAL_TABLET | Freq: Every day | ORAL | 3 refills | Status: DC

## 2017-03-30 DIAGNOSIS — I059 Rheumatic mitral valve disease, unspecified: Secondary | ICD-10-CM | POA: Diagnosis not present

## 2017-04-14 DIAGNOSIS — Z7901 Long term (current) use of anticoagulants: Secondary | ICD-10-CM | POA: Diagnosis not present

## 2017-04-14 DIAGNOSIS — J019 Acute sinusitis, unspecified: Secondary | ICD-10-CM | POA: Diagnosis not present

## 2017-04-22 DIAGNOSIS — I059 Rheumatic mitral valve disease, unspecified: Secondary | ICD-10-CM | POA: Diagnosis not present

## 2017-04-29 DIAGNOSIS — I059 Rheumatic mitral valve disease, unspecified: Secondary | ICD-10-CM | POA: Diagnosis not present

## 2017-05-13 DIAGNOSIS — Z952 Presence of prosthetic heart valve: Secondary | ICD-10-CM | POA: Diagnosis not present

## 2017-05-27 DIAGNOSIS — I059 Rheumatic mitral valve disease, unspecified: Secondary | ICD-10-CM | POA: Diagnosis not present

## 2017-06-08 DIAGNOSIS — I059 Rheumatic mitral valve disease, unspecified: Secondary | ICD-10-CM | POA: Diagnosis not present

## 2017-06-16 ENCOUNTER — Other Ambulatory Visit: Payer: Self-pay | Admitting: Neurology

## 2017-06-16 DIAGNOSIS — G309 Alzheimer's disease, unspecified: Secondary | ICD-10-CM

## 2017-06-16 NOTE — Progress Notes (Signed)
Mri bra

## 2017-06-20 DIAGNOSIS — I059 Rheumatic mitral valve disease, unspecified: Secondary | ICD-10-CM | POA: Diagnosis not present

## 2017-06-24 ENCOUNTER — Other Ambulatory Visit: Payer: Medicare Other

## 2017-06-24 ENCOUNTER — Ambulatory Visit
Admission: RE | Admit: 2017-06-24 | Discharge: 2017-06-24 | Disposition: A | Discharge status: Home / Self Care | Payer: No Typology Code available for payment source | Source: Ambulatory Visit | Attending: Neurology | Admitting: Neurology

## 2017-06-24 DIAGNOSIS — G309 Alzheimer's disease, unspecified: Secondary | ICD-10-CM

## 2017-06-27 ENCOUNTER — Encounter: Payer: Self-pay | Admitting: Neurology

## 2017-06-27 ENCOUNTER — Ambulatory Visit (INDEPENDENT_AMBULATORY_CARE_PROVIDER_SITE_OTHER): Payer: Medicare Other | Admitting: Neurology

## 2017-06-27 VITALS — BP 190/68 | HR 58 | Ht 63.0 in | Wt 136.0 lb

## 2017-06-27 DIAGNOSIS — Z7901 Long term (current) use of anticoagulants: Secondary | ICD-10-CM | POA: Diagnosis not present

## 2017-06-27 DIAGNOSIS — I059 Rheumatic mitral valve disease, unspecified: Secondary | ICD-10-CM | POA: Diagnosis not present

## 2017-06-27 DIAGNOSIS — F0150 Vascular dementia without behavioral disturbance: Secondary | ICD-10-CM

## 2017-06-27 NOTE — Progress Notes (Signed)
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 The family has noticed progressive memory loss and mild cognitive difficulties over the last 1 year stroke. He has been increasingly forgetful in locking the house or turning of the lights when he goes out. He also has trouble remembering names of people but he can remember this after a while. He does also have mild anxiety which seems to have increased recently but this is not any medications for that.He has bilateral hearing loss since last 5 years following complications of gentamicin usage for treatment for mitral valve endocarditis with prolonged antibiotics 5 years ago. He underwent mitral valve replacement at that time and has been on warfarin since then with a relatively stable INR. He denies any history of strokes, TIAs, speech problems, gait or balance problems. There have been no significant behavioral issues, hallucinations, delusions, significant headache noted. His gait and balance seem fine and there is no fall or safety risks identified. There is no prior history of seizures, significant head injury or loss of consciousness. Is no family history of dementia. He has not had any evaluation for treatable causes of memory loss or brain imaging done recently.he remains independent with activities of daily living and is able to take care of his own needs.  Update 1/8/2016PS : He returns for follow-up today after his last visit 3 months ago accompanied by his son. He has noticed improvement in his memory as well as concentration and cognitive difficulties. He has been taking Cerefolin and reservetrol every day . He also participates in cognitively challenging activities. He recently traveled to [place] for a month and  had slight difficulties in coping. He forgot his INR machine. He remains independent with activities of daily living and does not need help with any activities. He has no neuro neurological complaints. He underwent lab work for treatable causes of cognitive impairment which was negative. EEG showed no seizure activity. MRI scan of the brain personally reviewed shows mild changes of chronic microvascular ischemia and microhemorrhages  Update 11/22/2014 PS: He returns for follow-up after last visit 6 months ago. Is accompanied by his wife and son who contributed to the history. They have noticed cognitive decline since the last visit With the patient having good days and bad days and being confused and disoriented particularly when he has to learn some new activity in task. At times he is had trouble recognizing familiar faces on one occasion he had trouble coming up with his daughter's name. He was seen by his primary physician recently who gave him a sample pack of Namenda to try but he has not started that yet. He denies any fall, injury, focal symptoms suggestive of a stroke in the form of speech difficulties extremity weakness numbness gait or balance problems. He remains on warfarin which is tolerating well with INR being fairly stable. Update 12/20/2014 PS: He returns for follow-up after last visit 1 month ago. He initially reported some side effects on Namenda in the form of cough and feeling strange but that seems to have settled down. He was having some bad dreams initially and that also seems to be improving. He feels overall he is quite well during the day with better memory and concentration. However at nights he is restless and  often wakes up early in the morning. He does Nap for 10-15 minutes during the daytime but does manage to sleep 5-6 hours every night. He states his INR has been quite stable and he is on a fairly regular dose of warfarin. On Mini-Mental status exam today scored 27/30 which is  much improved from 22/30 at last visit.  11/9/2016PS : He returns for follow-up to last visit 3 months ago. Is accompanied by his wife. Patient continues to have noticed improvement in his memory and cognition after taking Namenda XR 28 mg daily. He keeps himself busy by solving crossword puzzles, doing sudoku and computer brain games. He had recent eye surgery for blocked lacrimal ducts which went well. He does have some bruises from the surgery on his periorbital region. The patient has several questions about his driving as well as ability to travel alone to visit his [place] in [place]. On Mini-Mental status exam today scored 25/30 which is not significantly changed from 27/30 at last visit. He was able to name 14 four legged animals and clock drawing score was 3/4.He is sleeping better. And has no complaints UPDATE 05/10/2017CM Patient returns for six-month follow-up with his wife for memory loss. He and his wife both think his memory is about the same. He is taking Namenda XR 28 mg daily. He continues to do games on the computer. He continues to drive short distances in familiar places without getting loss. He has no new complaints.  UPDATE 11/10/17CM [name], 82 year old male returns for follow-up with his wife. He has a history of memory loss. The patient thinks his memory is about the same, the wife thinks he is more forgetful. His score is less than it was at his last visit. He is currently on Namenda XR 28  mg daily. He has never been on Aricept. He continues to drive short distances and familiar places without getting lost however the wife tells me today  sometimes she is afraid to ride with him. He has also had a sporadic hallucination at night several times since last seen. It sounds more like shadows in the dark to me on description. No other medical issues since last seen. He has significant hearing loss. He returns for reevaluation.  Update 06/30/2016 : He returns for follow-up after last  visit 3 months ago. He has tolerated Aricept 5 mg daily without any GI or CNS side effects. There has been some improvement in his memory and cognition but patient's wife states that particularly patient is unable to recognize only her. He has no trouble recognizing other family members and friends. Patient has still been driving but wife is concerned about his safety about doing so. Patient remains on Coumadin with INR being fairly stable. He has I  had no stroke or TIA symptoms. There have been no unsafe behavior or trouble with   agitation. There have been not many delusions or hallucinations. He has no trouble with walking and balance and there have been no falls Update 12/15/2016 : He returns for follow-up after last visit 6 months ago. Is accompanied by his wife. He has been able to tolerate Namzaric well without any obvious side effects and in fact both of them feel there may be some cognitive stabilization. He is not able to recognize his wife mostly does get occasionally confused. He still needs close supervision. He is still driving but has agreed to drive on the with his wife next to him. He has had no episodes of  getting lost any agitation or violent behavior. There have been no hallucinations or delusions. He has unfortunately lost his hearing aid 2 days ago and has having trouble hearing. Despite this he did better on the Mini-Mental testing in office today compared to last visit. Patient in pounds with a few weeks ago he had minor fall when he hit the back of his head but did not lose consciousness. There was no bleeding or swelling noted in his scalp. On exam today there is no tenderness or bruises noted by me. He denies any headache, gait or balance difficulties or neurological change after that fall. He remains on warfarin which is tolerating well and his INR has been stable. Update 06/27/2017 ; he returns for follow-up after last of that 6 months ago. Is accompanied by his wife. She states that his  remaining more or less the same cognitively. Does have periods of intermittent confusion and disorientation. At times he still does not recognize his wife. He can be redirected easily. There has been no significant agitation or violent behavior. This mentions a delusions. There are no unsafe behaviors. He does not wander out. He does socialize still quite a bit but his wife is always with her. Is tolerating warfarin well without bleeding or bruising. He is scheduled to have a follow-up echocardiogram by his cardiologist next week. He has decided to participate in the trachea visit dementia trial and had study specific screening MRI done last week which have personally reviewed shows no significant change from prior MRI 3 years ago. On Mini-Mental status testing today scored 21/30 which is slightly low from 23/30 from last visit. He remains on nonselective which is tolerating well without any side effects. He also takes Resevetarol and cerfolin nac:   REVIEW OF SYSTEMS: Full 14 system review of systems performed and notable only for those listed, all others are neg:  Memory loss, hearing loss, confusion and all other systems negative ALLERGIES: Allergies  Allergen Reactions  . Ampicillin Other (See Comments)    Ask Patient  . Niacin Other (See Comments)    Gout  . Penicillins     HOME MEDICATIONS: Outpatient Medications Prior to Visit  Medication Sig Dispense Refill  . allopurinol (ZYLOPRIM) 300 MG tablet Take 300 mg by mouth daily. Taking 1/2 tablet daily    . atorvastatin (LIPITOR) 80 MG tablet Take 80 mg by mouth daily.    . cetirizine (ZYRTEC) 5 MG tablet Take 5 mg by mouth daily.  0  . chlorhexidine (PERIDEX) 0.12 % solution daily. as directed  99  . glucosamine-chondroitin (GLUCOSAMINE-CHONDROITIN DS) 500-400 MG tablet Take by mouth.    . Memantine HCl-Donepezil HCl (NAMZARIC) 28-10 MG CP24 Take 1 capsule by mouth daily. 90 capsule 3  . Methylfol-Methylcob-Acetylcyst (CEREFOLIN NAC)  6-2-600 MG TABS Take 1 tablet daily by mouth. 90 each 3  . metoprolol tartrate (LOPRESSOR) 25 MG tablet Take 25 mg by mouth daily.    Marland Kitchen neomycin-polymyxin b-dexamethasone (MAXITROL) 3.5-10000-0.1 SUSP INSTILL ONE DROP IN EACH EYE FOUR TIMES DAILY  0  . warfarin (COUMADIN) 5 MG tablet Take one tablet daily or as directed.    . warfarin (COUMADIN) 6 MG tablet Take 6 mg by mouth daily.    Marland Kitchen ezetimibe (ZETIA) 10 MG tablet Take 10 mg by mouth daily.    . metoprolol (LOPRESSOR) 50 MG tablet      No facility-administered medications prior to visit.     PAST MEDICAL HISTORY: Past Medical History:  Diagnosis Date  .  Arthritis   . Coronary artery disease   . Hyperlipemia   . Hypertension   . Memory loss     PAST SURGICAL HISTORY: Past Surgical History:  Procedure Laterality Date  . CORONARY ARTERY BYPASS GRAFT    . EYE SURGERY     both cataracts  . MITRAL VALVE SURGERY    . PROSTATE BIOPSY  2002  . SHOULDER ARTHROSCOPY     right  . SHOULDER ARTHROSCOPY W/ ROTATOR CUFF REPAIR  2002   left-dsc  . TONSILLECTOMY    . TRIGGER FINGER RELEASE Left 11/07/2012   Procedure: RELEASE TRIGGER FINGER/A-1 PULLEY LEFT RING;  Surgeon: Wyn Forster., MD;  Location: [facility];  Service: Orthopedics;  Laterality: Left;    FAMILY HISTORY: History reviewed. No pertinent family history.  SOCIAL HISTORY: Social History   Socioeconomic History  . Marital status: Married    Spouse name: Not on file  . Number of children: 3  . Years of education: MD  . Highest education level: Not on file  Social Needs  . Financial resource strain: Not on file  . Food insecurity - worry: Not on file  . Food insecurity - inability: Not on file  . Transportation needs - medical: Not on file  . Transportation needs - non-medical: Not on file  Occupational History  . Not on file  Tobacco Use  . Smoking status: Never Smoker  . Smokeless tobacco: Never Used  Substance and Sexual Activity  .  Alcohol use: Yes    Alcohol/week: 0.0 oz    Comment: occ  . Drug use: Yes    Types: Hydrocodone  . Sexual activity: Not on file  Other Topics Concern  . Not on file  Social History Narrative   Patient is married with 3 children.   Patient is right handed.   Patient is a Proofreader.   Patient drinks 2 cups daily.     PHYSICAL EXAM  Vitals:   06/27/17 0857  BP: (!) 190/68  Pulse: (!) 58  Weight: 136 lb (61.7 kg)  Height: 5\' 3"  (1.6 m)   Body mass index is 24.09 kg/m. General: well developed, well nourished, elderly  [place] male seated, in no evident distress Head: head normocephalic and atraumatic.  Neck: supple with no carotid  bruits Cardiovascular: regular rate and rhythm, no murmurs Musculoskeletal: no deformity Skin: no rash/petichiae.  Vascular: Normal pulses all extremities  Neurological examination   Mentation:Awake and  alert. Marland Kitchen  MMSE - Mini Mental State Exam 06/27/2017 12/15/2016 06/30/2016  Orientation to time 2 4 3   Orientation to Place 5 5 3   Registration 3 3 2   Attention/ Calculation 2 0 4  Recall 3 3 2   Language- name 2 objects 1 2 2   Language- repeat 0 1 0  Language- follow 3 step command 3 2 3   Language- read & follow direction 1 1 1   Write a sentence 0 1 1  Copy design 1 1 1   Total score 21 23 22      Cranial Nerves:  Pupils equal, briskly reactive to light. Extraocular movements full without nystagmus. Visual fields full to confrontation. Hearing reduced bilaterallyt. Facial sensation intact. Face, tongue, palate moves normally and symmetrically.  Motor: Normal bulk and tone. Normal strength in all tested extremity muscles. Sensory.: intact to touch , pinprick , position and vibratory sensation.  Coordination: Rapid alternating movements normal in all extremities. Finger-to-nose and heel-to-shin performed accurately bilaterally. Gait and Station: Arises from chair without  difficulty. Stance is normal. Gait demonstrates normal stride  length and balance . Unable to heel, toe and tandem walk without difficulty. No assistive device Reflexes: 1+ and symmetric. Toes downgoing.    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 Some stabilization on Namzaric  PLAN:   I had a long discussion with the patient and his wife regarding his mild dementia which appears to be fairly stable on the current medication regimen of Namzaric daily. Continue warfarin for his mechanical heart valve. He was advised to continue participation in mentally challenging activities. He was also advised to l not drive He voiced understanding. He  Is screening for participation in the [employer] dementia trial   He will return for follow-up in 6 months or call earlier if necessaryGreater than 50% time during this 35 minute visit was spent on counseling and coordination of care about his dementia and discussion of available treatment options,availibility of clinical trials and his driving ability and answering questions He will return for follow-up in 3 months or call earlier if necessary Delia Heady, MD [facility] [address] [place], [place] [address] [phone]

## 2017-06-27 NOTE — Patient Instructions (Signed)
I  had a long discussion with the patient and his wife regarding his mild dementia which appears to be fairly stable on the current medication regimen of Namzaric daily. Continue warfarin for his mechanical heart valve. He was advised to continue participation in mentally challenging activities. He was also advised not to drive a . He voiced understanding. He is trying to participate the [employer] dementia trial if interested.   He will return for follow-up in 6 months or call earlier if necessary

## 2017-07-05 ENCOUNTER — Other Ambulatory Visit: Payer: Self-pay | Admitting: Neurology

## 2017-07-05 DIAGNOSIS — I059 Rheumatic mitral valve disease, unspecified: Secondary | ICD-10-CM | POA: Diagnosis not present

## 2017-07-12 DIAGNOSIS — Z7901 Long term (current) use of anticoagulants: Secondary | ICD-10-CM | POA: Diagnosis not present

## 2017-07-12 DIAGNOSIS — Z952 Presence of prosthetic heart valve: Secondary | ICD-10-CM | POA: Diagnosis not present

## 2017-07-19 DIAGNOSIS — Z952 Presence of prosthetic heart valve: Secondary | ICD-10-CM | POA: Diagnosis not present

## 2017-07-19 DIAGNOSIS — Z7901 Long term (current) use of anticoagulants: Secondary | ICD-10-CM | POA: Diagnosis not present

## 2017-07-26 DIAGNOSIS — Z952 Presence of prosthetic heart valve: Secondary | ICD-10-CM | POA: Diagnosis not present

## 2017-07-26 DIAGNOSIS — Z7901 Long term (current) use of anticoagulants: Secondary | ICD-10-CM | POA: Diagnosis not present

## 2017-07-26 DIAGNOSIS — I059 Rheumatic mitral valve disease, unspecified: Secondary | ICD-10-CM | POA: Diagnosis not present

## 2017-08-02 DIAGNOSIS — I059 Rheumatic mitral valve disease, unspecified: Secondary | ICD-10-CM | POA: Diagnosis not present

## 2017-08-05 ENCOUNTER — Other Ambulatory Visit: Payer: Self-pay

## 2017-08-12 DIAGNOSIS — Z952 Presence of prosthetic heart valve: Secondary | ICD-10-CM | POA: Diagnosis not present

## 2017-08-12 DIAGNOSIS — Z7901 Long term (current) use of anticoagulants: Secondary | ICD-10-CM | POA: Diagnosis not present

## 2017-08-25 ENCOUNTER — Other Ambulatory Visit: Payer: Self-pay

## 2017-08-26 DIAGNOSIS — I059 Rheumatic mitral valve disease, unspecified: Secondary | ICD-10-CM | POA: Diagnosis not present

## 2017-09-02 DIAGNOSIS — I059 Rheumatic mitral valve disease, unspecified: Secondary | ICD-10-CM | POA: Diagnosis not present

## 2017-09-16 DIAGNOSIS — I059 Rheumatic mitral valve disease, unspecified: Secondary | ICD-10-CM | POA: Diagnosis not present

## 2017-10-14 DIAGNOSIS — I059 Rheumatic mitral valve disease, unspecified: Secondary | ICD-10-CM | POA: Diagnosis not present

## 2017-11-01 ENCOUNTER — Other Ambulatory Visit: Payer: Self-pay | Admitting: Neurology

## 2017-11-01 DIAGNOSIS — F0280 Dementia in other diseases classified elsewhere without behavioral disturbance: Secondary | ICD-10-CM

## 2017-11-10 DIAGNOSIS — I059 Rheumatic mitral valve disease, unspecified: Secondary | ICD-10-CM | POA: Diagnosis not present

## 2017-11-11 DIAGNOSIS — E785 Hyperlipidemia, unspecified: Secondary | ICD-10-CM | POA: Diagnosis not present

## 2017-11-11 DIAGNOSIS — Z7901 Long term (current) use of anticoagulants: Secondary | ICD-10-CM | POA: Diagnosis not present

## 2017-11-11 DIAGNOSIS — I2510 Atherosclerotic heart disease of native coronary artery without angina pectoris: Secondary | ICD-10-CM | POA: Diagnosis not present

## 2017-11-11 DIAGNOSIS — Z952 Presence of prosthetic heart valve: Secondary | ICD-10-CM | POA: Insufficient documentation

## 2017-11-15 ENCOUNTER — Other Ambulatory Visit: Payer: Self-pay | Admitting: Neurology

## 2017-11-15 DIAGNOSIS — F0280 Dementia in other diseases classified elsewhere without behavioral disturbance: Secondary | ICD-10-CM

## 2017-11-18 ENCOUNTER — Other Ambulatory Visit: Payer: Self-pay

## 2017-12-08 DIAGNOSIS — M722 Plantar fascial fibromatosis: Secondary | ICD-10-CM | POA: Diagnosis not present

## 2017-12-08 DIAGNOSIS — I059 Rheumatic mitral valve disease, unspecified: Secondary | ICD-10-CM | POA: Diagnosis not present

## 2017-12-08 DIAGNOSIS — Z7901 Long term (current) use of anticoagulants: Secondary | ICD-10-CM | POA: Diagnosis not present

## 2017-12-08 DIAGNOSIS — M6701 Short Achilles tendon (acquired), right ankle: Secondary | ICD-10-CM | POA: Diagnosis not present

## 2017-12-14 DIAGNOSIS — Z136 Encounter for screening for cardiovascular disorders: Secondary | ICD-10-CM | POA: Diagnosis not present

## 2017-12-14 DIAGNOSIS — E785 Hyperlipidemia, unspecified: Secondary | ICD-10-CM | POA: Diagnosis not present

## 2017-12-14 DIAGNOSIS — Z1331 Encounter for screening for depression: Secondary | ICD-10-CM | POA: Diagnosis not present

## 2017-12-14 DIAGNOSIS — Z125 Encounter for screening for malignant neoplasm of prostate: Secondary | ICD-10-CM | POA: Diagnosis not present

## 2017-12-14 DIAGNOSIS — Z0000 Encounter for general adult medical examination without abnormal findings: Secondary | ICD-10-CM | POA: Diagnosis not present

## 2017-12-14 DIAGNOSIS — Z9181 History of falling: Secondary | ICD-10-CM | POA: Diagnosis not present

## 2017-12-15 ENCOUNTER — Other Ambulatory Visit: Payer: Self-pay

## 2017-12-26 ENCOUNTER — Ambulatory Visit: Payer: Medicare Other | Admitting: Neurology

## 2017-12-27 ENCOUNTER — Ambulatory Visit (INDEPENDENT_AMBULATORY_CARE_PROVIDER_SITE_OTHER): Payer: Medicare Other | Admitting: Neurology

## 2017-12-27 ENCOUNTER — Encounter: Payer: Self-pay | Admitting: Neurology

## 2017-12-27 VITALS — BP 178/76 | HR 54 | Ht 63.0 in | Wt 139.6 lb

## 2017-12-27 DIAGNOSIS — R451 Restlessness and agitation: Secondary | ICD-10-CM

## 2017-12-27 MED ORDER — DIVALPROEX SODIUM 500 MG PO DR TAB: 500.0000 mg | DELAYED_RELEASE_TABLET | Freq: Every day | ORAL | 2 refills | Status: DC

## 2017-12-27 NOTE — Patient Instructions (Signed)
I had a long discussion with the patient and his wife regarding his recent episode of agitation and depressive ideation. This apparently started after taking a few doses of clindamycin for upcoming dental surgery but I do not think they're necessarily related. I recommend a trial of Depakote DR 500 mg daily to help with behavioral agitation. The patient will need closer monitoring of his INR due to possible reaction. I'm reluctant in trying other stronger medications because of his cardiac history. Continue Namzaric in the current dosage and Cerefolin nac. Patient's wife was also educated about practical tips in managing agitation. She is also looking into long-term care for him so that his not left alone. I have counseled him not to drive. He will return for follow-up in 2 months or call earlier if necessary

## 2017-12-27 NOTE — Progress Notes (Signed)
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 The family has noticed progressive memory loss and mild cognitive difficulties over the last 1 year stroke. He has been increasingly forgetful in locking the house or turning of the lights when he goes out. He also has trouble remembering names of people but he can remember this after a while. He does also have mild anxiety which seems to have increased recently but this is not any medications for that.He has bilateral hearing loss since last 5 years following complications of gentamicin usage for treatment for mitral valve endocarditis with prolonged antibiotics 5 years ago. He underwent mitral valve replacement at that time and has been on warfarin since then with a relatively stable INR. He denies any history of strokes, TIAs, speech problems, gait or balance problems. There have been no significant behavioral issues, hallucinations, delusions, significant headache noted. His gait and balance seem fine and there is no fall or safety risks identified. There is no prior history of seizures, significant head injury or loss of consciousness. Is no family history of dementia. He has not had any evaluation for treatable causes of memory loss or brain imaging done recently.he remains independent with activities of daily living and is able to take care of his own needs.  Update 1/8/2016PS : He returns for follow-up today after his last visit 3 months ago accompanied by his son. He has noticed improvement in his memory as well as concentration and cognitive difficulties. He has been taking Cerefolin and reservetrol every day . He also participates in cognitively challenging activities. He recently traveled to [place] for a month and  had slight difficulties in coping. He forgot his INR machine. He remains independent with activities of daily living and does not need help with any activities. He has no neuro neurological complaints. He underwent lab work for treatable causes of cognitive impairment which was negative. EEG showed no seizure activity. MRI scan of the brain personally reviewed shows mild changes of chronic microvascular ischemia and microhemorrhages  Update 11/22/2014 PS: He returns for follow-up after last visit 6 months ago. Is accompanied by his wife and son who contributed to the history. They have noticed cognitive decline since the last visit With the patient having good days and bad days and being confused and disoriented particularly when he has to learn some new activity in task. At times he is had trouble recognizing familiar faces on one occasion he had trouble coming up with his daughter's name. He was seen by his primary physician recently who gave him a sample pack of Namenda to try but he has not started that yet. He denies any fall, injury, focal symptoms suggestive of a stroke in the form of speech difficulties extremity weakness numbness gait or balance problems. He remains on warfarin which is tolerating well with INR being fairly stable. Update 12/20/2014 PS: He returns for follow-up after last visit 1 month ago. He initially reported some side effects on Namenda in the form of cough and feeling strange but that seems to have settled down. He was having some bad dreams initially and that also seems to be improving. He feels overall he is quite well during the day with better memory and concentration. However at nights he is restless and  often wakes up early in the morning. He does Nap for 10-15 minutes during the daytime but does manage to sleep 5-6 hours every night. He states his INR has been quite stable and he is on a fairly regular dose of warfarin. On Mini-Mental status exam today scored 27/30 which is  much improved from 22/30 at last visit.  11/9/2016PS : He returns for follow-up to last visit 3 months ago. Is accompanied by his wife. Patient continues to have noticed improvement in his memory and cognition after taking Namenda XR 28 mg daily. He keeps himself busy by solving crossword puzzles, doing sudoku and computer brain games. He had recent eye surgery for blocked lacrimal ducts which went well. He does have some bruises from the surgery on his periorbital region. The patient has several questions about his driving as well as ability to travel alone to [place] in [place]. On Mini-Mental status exam today scored 25/30 which is not significantly changed from 27/30 at last visit. He was able to name 14 four legged animals and clock drawing score was 3/4.He is sleeping better. And has no complaints UPDATE 05/10/2017CM Patient returns for six-month follow-up with his wife for memory loss. He and his wife both think his memory is about the same. He is taking Namenda XR 28 mg daily. He continues to do games on the computer. He continues to drive short distances in familiar places without getting loss. He has no new complaints.  UPDATE 11/10/17CM Tanner Chambers, 82 year old male returns for follow-up with his wife. He has a history of memory loss. The patient thinks his memory is about the same, the wife thinks he is more forgetful. His score is less than it was at his last visit. He is currently on Namenda XR 28  mg daily. He has never been on Aricept. He continues to drive short distances and familiar places without getting lost however the wife tells me today  sometimes she is afraid to ride with him. He has also had a sporadic hallucination at night several times since last seen. It sounds more like shadows in the dark to me on description. No other medical issues since last seen. He has significant hearing loss. He returns for reevaluation.  Update 06/30/2016 : He returns for follow-up after last  visit 3 months ago. He has tolerated Aricept 5 mg daily without any GI or CNS side effects. There has been some improvement in his memory and cognition but patient's wife states that particularly patient is unable to recognize only her. He has no trouble recognizing other family members and friends. Patient has still been driving but wife is concerned about his safety about doing so. Patient remains on Coumadin with INR being fairly stable. He has I  had no stroke or TIA symptoms. There have been no unsafe behavior or trouble with   agitation. There have been not many delusions or hallucinations. He has no trouble with walking and balance and there have been no falls Update 12/15/2016 : He returns for follow-up after last visit 6 months ago. Is accompanied by his wife. He has been able to tolerate Namzaric well without any obvious side effects and in fact both of them feel there may be some cognitive stabilization. He is not able to recognize his wife mostly does get occasionally confused. He still needs close supervision. He is still driving but has agreed to drive on the with his wife next to him. He has had no episodes of  getting lost any agitation or violent behavior. There have been no hallucinations or delusions. He has unfortunately lost his hearing aid 2 days ago and has having trouble hearing. Despite this he did better on the Mini-Mental testing in office today compared to last visit. Patient in pounds with a few weeks ago he had minor fall when he hit the back of his head but did not lose consciousness. There was no bleeding or swelling noted in his scalp. On exam today there is no tenderness or bruises noted by me. He denies any headache, gait or balance difficulties or neurological change after that fall. He remains on warfarin which is tolerating well and his INR has been stable. Update 06/27/2017 ; he returns for follow-up after last of that 6 months ago. Is accompanied by his wife. She states that his  remaining more or less the same cognitively. Does have periods of intermittent confusion and disorientation. At times he still does not recognize his wife. He can be redirected easily. There has been no significant agitation or violent behavior. This mentions a delusions. There are no unsafe behaviors. He does not wander out. He does socialize still quite a bit but his wife is always with her. Is tolerating warfarin well without bleeding or bruising. He is scheduled to have a follow-up echocardiogram by his cardiologist next week. He has decided to participate in the trachea visit dementia trial and had study specific screening MRI done last week which have personally reviewed shows no significant change from prior MRI 3 years ago. On Mini-Mental status testing today scored 21/30 which is slightly low from 23/30 from last visit. He remains on nonselective which is tolerating well without any side effects. He also takes Resevetarol and cerfolin nac:  Update 12/27/2017 : The patient returns for scheduled 6 monthly follow-up visit. His wife informs me that he had an episode of agitated delirium a few weeks ago. Patient had taken a few doses of clindamycin for a presumed dental surgery which eventually had to be postponed since his INR was quite elevated. The patient forcibly took the keys to the car and drove and apparently got disoriented and lost but eventually was able to make his way back home. He was also quite agitated and restless for couple of days. He called up his brother-in-law and express some desire to end it all. The patient however has improved since then. He seems to be his usual self as per his wife for the last few weeks. On the depression screening questionnaire today in the office he scored only 2 periods on the Mini-Mental status he showed a decline in his score of 16/30 compared to previous scored 21/30 but the patient and wife both felt that he did not understand the questions due to his hearing  difficulty. The patient remains on them so they consider full and pain. He's had no prior history of depression on medications for that. The patient was screened for   for the [employer] dementia study but failed to qualify due to having multiple cerebral microhemorrhages on MRI. REVIEW OF SYSTEMS: Full 14 system review of systems performed and notable only for those listed, all others are neg:  Memory loss, hearing loss, confusion , ringing in the ears, runny nose, bladder urgency, frequency of urination, agitation, depression, hallucinations, snoring and all other systems negative ALLERGIES: Allergies  Allergen Reactions  . Ampicillin Other (See Comments)    Ask Patient  . Niacin Other (See Comments)    Gout  .  Penicillins     HOME MEDICATIONS: Outpatient Medications Prior to Visit  Medication Sig Dispense Refill  . allopurinol (ZYLOPRIM) 300 MG tablet Take 300 mg by mouth daily. Taking 1/2 tablet daily    . atorvastatin (LIPITOR) 80 MG tablet Take 80 mg by mouth daily.    . cetirizine (ZYRTEC) 5 MG tablet Take 5 mg by mouth daily.  0  . chlorhexidine (PERIDEX) 0.12 % solution daily. as directed  99  . glucosamine-chondroitin (GLUCOSAMINE-CHONDROITIN DS) 500-400 MG tablet Take by mouth.    . Methylfol-Methylcob-Acetylcyst (CEREFOLIN NAC) 6-2-600 MG TABS Take 1 tablet daily by mouth. 90 each 3  . metoprolol tartrate (LOPRESSOR) 25 MG tablet Take 25 mg by mouth daily.    Marland Kitchen. NAMZARIC 28-10 MG CP24 TAKE 1 CAPSULE BY MOUTH  DAILY 90 capsule 1  . neomycin-polymyxin b-dexamethasone (MAXITROL) 3.5-10000-0.1 SUSP INSTILL ONE DROP IN EACH EYE FOUR TIMES DAILY  0  . warfarin (COUMADIN) 5 MG tablet Take one tablet daily or as directed.    . warfarin (COUMADIN) 6 MG tablet Take 6 mg by mouth daily.    Marland Kitchen. warfarin (COUMADIN) 6 MG tablet Take by mouth.     No facility-administered medications prior to visit.     PAST MEDICAL HISTORY: Past Medical History:  Diagnosis Date  . Arthritis   .  Coronary artery disease   . Hyperlipemia   . Hypertension   . Memory loss     PAST SURGICAL HISTORY: Past Surgical History:  Procedure Laterality Date  . CORONARY ARTERY BYPASS GRAFT    . EYE SURGERY     both cataracts  . MITRAL VALVE SURGERY    . PROSTATE BIOPSY  2002  . SHOULDER ARTHROSCOPY     right  . SHOULDER ARTHROSCOPY W/ ROTATOR CUFF REPAIR  2002   left-dsc  . TONSILLECTOMY    . TRIGGER FINGER RELEASE Left 11/07/2012   Procedure: RELEASE TRIGGER FINGER/A-1 PULLEY LEFT RING;  Surgeon: Wyn Forsterobert V Sypher Jr., MD;  Location: [facility];  Service: Orthopedics;  Laterality: Left;    FAMILY HISTORY: History reviewed. No pertinent family history.  SOCIAL HISTORY: Social History   Socioeconomic History  . Marital status: Married    Spouse name: Not on file  . Number of children: 3  . Years of education: MD  . Highest education level: Not on file  Occupational History  . Not on file  Social Needs  . Financial resource strain: Not on file  . Food insecurity:    Worry: Not on file    Inability: Not on file  . Transportation needs:    Medical: Not on file    Non-medical: Not on file  Tobacco Use  . Smoking status: Never Smoker  . Smokeless tobacco: Never Used  Substance and Sexual Activity  . Alcohol use: Yes    Alcohol/week: 0.0 standard drinks    Comment: occ  . Drug use: Yes    Types: Hydrocodone  . Sexual activity: Not on file  Lifestyle  . Physical activity:    Days per week: Not on file    Minutes per session: Not on file  . Stress: Not on file  Relationships  . Social connections:    Talks on phone: Not on file    Gets together: Not on file    Attends religious service: Not on file    Active member of club or organization: Not on file    Attends meetings of clubs or organizations: Not on file  Relationship status: Not on file  . Intimate partner violence:    Fear of current or ex partner: Not on file    Emotionally abused: Not on  file    Physically abused: Not on file    Forced sexual activity: Not on file  Other Topics Concern  . Not on file  Social History Narrative   Patient is married with 3 children.   Patient is right handed.   Patient is a Proofreader.   Patient drinks 2 cups daily.     PHYSICAL EXAM  Vitals:   12/27/17 1008  BP: (!) 178/76  Pulse: (!) 54  Weight: 139 lb 9.6 oz (63.3 kg)  Height: 5\' 3"  (1.6 m)   Body mass index is 24.73 kg/m. General: well developed, well nourished, elderly  Asian [place] male seated, in no evident distress Head: head normocephalic and atraumatic.  Neck: supple with no carotid  bruits Cardiovascular: regular rate and rhythm, no murmurs Musculoskeletal: no deformity Skin: no rash/petichiae.  Vascular: Normal pulses all extremities  Neurological examination   Mentation:Awake and  alert. . On Mini-Mental status exam today he scored 16/30 with deficits in orientation, recall, repetition, writing and drawing. He was able to name 10 animals with 4 legs. On geriatric depression scale he scored only 2 not suggestive of significant depression MMSE - Mini Mental State Exam 12/27/2017 06/27/2017 12/15/2016  Orientation to time 3 2 4   Orientation to Place 1 5 5   Registration 3 3 3   Attention/ Calculation 2 2 0  Recall 1 3 3   Language- name 2 objects 2 1 2   Language- repeat 0 0 1  Language- follow 3 step command 3 3 2   Language- read & follow direction 1 1 1   Write a sentence 0 0 1  Copy design 0 1 1  Total score 16 21 23      Cranial Nerves:  Pupils equal, briskly reactive to light. Extraocular movements full without nystagmus. Visual fields full to confrontation. Hearing reduced bilaterallyt. Facial sensation intact. Face, tongue, palate moves normally and symmetrically.  Motor: Normal bulk and tone. Normal strength in all tested extremity muscles. Sensory.: intact to touch , pinprick , position and vibratory sensation.  Coordination: Rapid alternating  movements normal in all extremities. Finger-to-nose and heel-to-shin performed accurately bilaterally. Gait and Station: Arises from chair without difficulty. Stance is normal. Gait demonstrates normal stride length and balance . Unable to heel, toe and tandem walk without difficulty. No assistive device Reflexes: 1+ and symmetric. Toes downgoing.    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 Recent episode of behavioral agitation following antibiotic usage for dental procedure which had to be postponed with suspect mild underlying depression  PLAN:   I had a long discussion with the patient and his wife regarding his recent episode of agitation and depressive ideation. This apparently started after taking a few doses of clindamycin for upcoming dental surgery but I do not think they're necessarily related. I recommend a trial of Depakote DR 500 mg daily to help with behavioral agitation as this problem is likely going to recur with the stress of planned dental surgery. The patient will need closer monitoring of his INR due to possible reaction. I'm reluctant in trying other stronger medications because of his cardiac history. Continue Namzaric in the current dosage and Cerefolin nac.  Patient's wife was also educated about practical tips in managing agitation. She is also looking into long-term care for him so that his not left alone. I have counseled him not to drive. He will return for follow-up in 2 months or call earlier if necessary..Greater than 50% time during this 35 minute visit was spent on counseling and coordination of care about his dementia and discussion of available treatment options,availibility of clinical trials and his driving ability and answering questions   Delia HeadyPramod Tanyika Barros, MD [facility] [address], [address] [place], [place] [address] [phone]

## 2018-01-02 DIAGNOSIS — Z7901 Long term (current) use of anticoagulants: Secondary | ICD-10-CM | POA: Diagnosis not present

## 2018-01-02 DIAGNOSIS — I059 Rheumatic mitral valve disease, unspecified: Secondary | ICD-10-CM | POA: Diagnosis not present

## 2018-01-30 DIAGNOSIS — I059 Rheumatic mitral valve disease, unspecified: Secondary | ICD-10-CM | POA: Diagnosis not present

## 2018-01-30 DIAGNOSIS — Z7901 Long term (current) use of anticoagulants: Secondary | ICD-10-CM | POA: Diagnosis not present

## 2018-02-03 ENCOUNTER — Telehealth: Payer: Self-pay | Admitting: Neurology

## 2018-02-03 NOTE — Telephone Encounter (Signed)
Pt and pts wife called requesting a call to discuss if f/u is needed. Pt not taking medication divalproex (DEPAKOTE) 500 MG DR tablet

## 2018-02-06 NOTE — Telephone Encounter (Signed)
I called and left a message on the patient's wife answering machine to call me back to discuss the medication question

## 2018-02-13 NOTE — Telephone Encounter (Signed)
Spoke to the patient's wife who informed me that they had not started Depakote as patient was doing well and has not had any behavior or agitation. She had however fill the prescription but will keep it on hold unless his behavior changed again. I agreed with the plan. The patient also had an appointment to see me in a month I advised him to cancel that and instead schedule a routine 6 monthly follow-up visit. She voiced understanding.

## 2018-02-13 NOTE — Telephone Encounter (Signed)
PT appt for 02/2018 per DR.Sethi, pt r/s for 6 month follow up by Kris Hartmann.

## 2018-02-27 ENCOUNTER — Other Ambulatory Visit: Payer: Self-pay | Admitting: Neurology

## 2018-02-27 ENCOUNTER — Telehealth: Payer: Self-pay | Admitting: Neurology

## 2018-02-27 DIAGNOSIS — Z7901 Long term (current) use of anticoagulants: Secondary | ICD-10-CM | POA: Diagnosis not present

## 2018-02-27 DIAGNOSIS — G441 Vascular headache, not elsewhere classified: Secondary | ICD-10-CM

## 2018-02-27 DIAGNOSIS — I059 Rheumatic mitral valve disease, unspecified: Secondary | ICD-10-CM | POA: Diagnosis not present

## 2018-02-27 NOTE — Telephone Encounter (Signed)
Received phone call from the patient saying he wanted to talk to me. He stated that he was having a mild frontal headache for the last 3 months. He describes the headache as being mild to moderate. It is not disabling. He has not required any specific medications for this. He denies hitting his head or falling down. He has no prior history of migraines or headaches. The patient also stated that he wants to see me in the office and discuss issues. His wife was not available at that time. I called back an hour or 2 later and spoke to the patient's wife who informed me that she had not been mention anything about headaches so far. She stated that clinically he was doing about the same with good and bad days but no particular concerns. I recommend we check a CT scan of the head and an out of hospital since he is on blood thinners to make sure he does not have a subdural. I also asked her to talk to the patient and see if he needed a follow-up appointment to see me. She will call my office and let me know. She voiced understanding

## 2018-02-28 NOTE — Progress Notes (Signed)
MMSE dated  06/27/2017  on pt was done by Rocky Link CMA  ON 12/27/2017 pt was room by [employer]. Bethany Rn did MMSE on patient.

## 2018-02-28 NOTE — Telephone Encounter (Signed)
Patient's wife called and requested to speak with Dr. Marlis Edelson nurse. I asked her what the call was regarding and she said that Dr. Pearlean Brownie said to call and speak with Katrina in the morning regarding CT the patient may need. Please call and advise. [phone].

## 2018-02-28 NOTE — Telephone Encounter (Addendum)
Rn spoke with Dr Pearlean Brownie that pt needs MRI to be sen to [facility] for scheduling.MEssage sent to MRi coordinate [facility] can you send this Ct scan order to [facility] per Dr. Pearlean Brownie request. Its already order thanks.

## 2018-02-28 NOTE — Telephone Encounter (Signed)
Patient is scheduled at [facility] for Friday 03/03/18 arrival time is 10:00 AM. Patient wife is aware of time & day. She also has their number of [phone] incase she needed to r/s for any reason.

## 2018-03-03 DIAGNOSIS — G441 Vascular headache, not elsewhere classified: Secondary | ICD-10-CM | POA: Diagnosis not present

## 2018-03-03 DIAGNOSIS — R51 Headache: Secondary | ICD-10-CM | POA: Diagnosis not present

## 2018-03-04 DIAGNOSIS — Z23 Encounter for immunization: Secondary | ICD-10-CM | POA: Diagnosis not present

## 2018-03-06 ENCOUNTER — Ambulatory Visit: Payer: Medicare Other | Admitting: Neurology

## 2018-03-06 DIAGNOSIS — I059 Rheumatic mitral valve disease, unspecified: Secondary | ICD-10-CM | POA: Diagnosis not present

## 2018-03-06 DIAGNOSIS — Z7901 Long term (current) use of anticoagulants: Secondary | ICD-10-CM | POA: Diagnosis not present

## 2018-03-07 ENCOUNTER — Telehealth: Payer: Self-pay | Admitting: Neurology

## 2018-03-07 NOTE — Telephone Encounter (Signed)
I reviewed CT head films and report from [facility] from 03/03/18 which showed no acute findings. Stable cortical atrophy unchanged from Feb 2019. I left message on patients`s home answering machine communicating results.

## 2018-03-13 DIAGNOSIS — Z7901 Long term (current) use of anticoagulants: Secondary | ICD-10-CM | POA: Diagnosis not present

## 2018-03-13 DIAGNOSIS — I059 Rheumatic mitral valve disease, unspecified: Secondary | ICD-10-CM | POA: Diagnosis not present

## 2018-03-27 DIAGNOSIS — I059 Rheumatic mitral valve disease, unspecified: Secondary | ICD-10-CM | POA: Diagnosis not present

## 2018-03-27 DIAGNOSIS — Z7901 Long term (current) use of anticoagulants: Secondary | ICD-10-CM | POA: Diagnosis not present

## 2018-04-24 DIAGNOSIS — Z7901 Long term (current) use of anticoagulants: Secondary | ICD-10-CM | POA: Diagnosis not present

## 2018-04-24 DIAGNOSIS — I059 Rheumatic mitral valve disease, unspecified: Secondary | ICD-10-CM | POA: Diagnosis not present

## 2018-04-25 DIAGNOSIS — M722 Plantar fascial fibromatosis: Secondary | ICD-10-CM | POA: Diagnosis not present

## 2018-04-25 DIAGNOSIS — M6701 Short Achilles tendon (acquired), right ankle: Secondary | ICD-10-CM | POA: Diagnosis not present

## 2018-04-26 ENCOUNTER — Other Ambulatory Visit: Payer: Self-pay | Admitting: Neurology

## 2018-04-26 DIAGNOSIS — F0280 Dementia in other diseases classified elsewhere without behavioral disturbance: Secondary | ICD-10-CM

## 2018-05-02 DIAGNOSIS — Z7901 Long term (current) use of anticoagulants: Secondary | ICD-10-CM | POA: Diagnosis not present

## 2018-05-02 DIAGNOSIS — I059 Rheumatic mitral valve disease, unspecified: Secondary | ICD-10-CM | POA: Diagnosis not present

## 2018-05-03 DIAGNOSIS — M7541 Impingement syndrome of right shoulder: Secondary | ICD-10-CM | POA: Diagnosis not present

## 2018-05-11 ENCOUNTER — Encounter: Payer: Self-pay | Admitting: Cardiology

## 2018-05-11 ENCOUNTER — Ambulatory Visit (INDEPENDENT_AMBULATORY_CARE_PROVIDER_SITE_OTHER): Payer: Medicare Other | Admitting: Cardiology

## 2018-05-11 VITALS — BP 126/72 | HR 67 | Ht 63.0 in | Wt 169.0 lb

## 2018-05-11 DIAGNOSIS — I6529 Occlusion and stenosis of unspecified carotid artery: Secondary | ICD-10-CM | POA: Insufficient documentation

## 2018-05-11 DIAGNOSIS — M719 Bursopathy, unspecified: Secondary | ICD-10-CM | POA: Insufficient documentation

## 2018-05-11 DIAGNOSIS — E785 Hyperlipidemia, unspecified: Secondary | ICD-10-CM

## 2018-05-11 DIAGNOSIS — I059 Rheumatic mitral valve disease, unspecified: Secondary | ICD-10-CM | POA: Insufficient documentation

## 2018-05-11 DIAGNOSIS — Z954 Presence of other heart-valve replacement: Secondary | ICD-10-CM

## 2018-05-11 DIAGNOSIS — I2510 Atherosclerotic heart disease of native coronary artery without angina pectoris: Secondary | ICD-10-CM | POA: Insufficient documentation

## 2018-05-11 DIAGNOSIS — D689 Coagulation defect, unspecified: Secondary | ICD-10-CM | POA: Insufficient documentation

## 2018-05-11 NOTE — Progress Notes (Signed)
Cardiology Office Note:    Date:  05/11/2018   ID:  Tanner DoveRanbir Richeson, DOB 23-Jan-1936, MRN [id number]  PCP:  Hurshel PartyMoon, Amy A, NP  Cardiologist:  Garwin Brothersajan R Revankar, MD   Referring MD: Hurshel PartyMoon, Amy A, NP    ASSESSMENT:    1. Mitral valve disorder   2. Dyslipidemia   3. History of mitral valve replacement with metallic valve    PLAN:    In order of problems listed above:  1. Primary prevention stressed with the patient.  Importance of compliance with diet and medication stressed and he vocalized understanding.  His blood pressure is stable.  Diet was discussed with dyslipidemia.  Lipids are followed by his primary care physician.  I reviewed blood work that was given to me today and his lipids are fine. 2. Echocardiogram will be done to assess and as a baseline.  This is post repair with the chemical prosthesis 3. Patient will be seen in follow-up appointment in 6 months or earlier if the patient has any concerns    Medication Adjustments/Labs and Tests Ordered: Current medicines are reviewed at length with the patient today.  Concerns regarding medicines are outlined above.  No orders of the defined types were placed in this encounter.  No orders of the defined types were placed in this encounter.    History of Present Illness:    Tanner Chambers is a 82 y.o. male who is being seen today for the evaluation of mitral valve replacement at the request of Moon, Amy A, NP.  This patient has been under my care in my previous practice.  He is here now to transfer his care and be established with my current practice.  The patient has history of dementia and is being treated for this.  He denies any problems at this time and his wife is very supportive and accompanies him for this visit.  Patient denies any chest pain orthopnea or PND.  At the time of my evaluation, the patient is alert awake oriented and in no distress.  Past Medical History:  Diagnosis Date  . Arthritis   . Coronary artery disease    . Hyperlipemia   . Hypertension   . Memory loss     Past Surgical History:  Procedure Laterality Date  . CORONARY ARTERY BYPASS GRAFT    . EYE SURGERY     both cataracts  . MITRAL VALVE SURGERY    . PROSTATE BIOPSY  2002  . SHOULDER ARTHROSCOPY     right  . SHOULDER ARTHROSCOPY W/ ROTATOR CUFF REPAIR  2002   left-dsc  . TONSILLECTOMY    . TRIGGER FINGER RELEASE Left 11/07/2012   Procedure: RELEASE TRIGGER FINGER/A-1 PULLEY LEFT RING;  Surgeon: Wyn Forsterobert V Sypher Jr., MD;  Location: [facility];  Service: Orthopedics;  Laterality: Left;    Current Medications: Current Meds  Medication Sig  . atorvastatin (LIPITOR) 80 MG tablet Take 80 mg by mouth daily.  . Methylfol-Methylcob-Acetylcyst (CEREFOLIN NAC) 6-2-600 MG TABS Take 1 tablet daily by mouth.  . metoprolol tartrate (LOPRESSOR) 25 MG tablet Take 12.5 mg by mouth daily.   . Multiple Vitamin (MULTIVITAMIN) capsule Take 1 capsule by mouth daily.  Marland Kitchen. NAMZARIC 28-10 MG CP24 TAKE 1 CAPSULE BY MOUTH  DAILY  . warfarin (COUMADIN) 6 MG tablet Take 6 mg by mouth daily.     Allergies:   Ampicillin; Niacin; and Penicillins   Social History   Socioeconomic History  . Marital status: Married  Spouse name: Not on file  . Number of children: 3  . Years of education: MD  . Highest education level: Not on file  Occupational History  . Not on file  Social Needs  . Financial resource strain: Not on file  . Food insecurity:    Worry: Not on file    Inability: Not on file  . Transportation needs:    Medical: Not on file    Non-medical: Not on file  Tobacco Use  . Smoking status: Never Smoker  . Smokeless tobacco: Never Used  Substance and Sexual Activity  . Alcohol use: Yes    Alcohol/week: 0.0 standard drinks    Comment: occ  . Drug use: Yes    Types: Hydrocodone  . Sexual activity: Not on file  Lifestyle  . Physical activity:    Days per week: Not on file    Minutes per session: Not on file  . Stress: Not  on file  Relationships  . Social connections:    Talks on phone: Not on file    Gets together: Not on file    Attends religious service: Not on file    Active member of club or organization: Not on file    Attends meetings of clubs or organizations: Not on file    Relationship status: Not on file  Other Topics Concern  . Not on file  Social History Narrative   Patient is married with 3 children.   Patient is right handed.   Patient is a ProofreaderMedical Doctor.   Patient drinks 2 cups daily.     Family History: The patient's family history is not on file.  ROS:   Please see the history of present illness.    All other systems reviewed and are negative.  EKGs/Labs/Other Studies Reviewed:    The following studies were reviewed today: EKG reveals sinus rhythm and nonspecific ST-T changes.   Recent Labs: No results found for requested labs within last 8760 hours.  Recent Lipid Panel No results found for: CHOL, TRIG, HDL, CHOLHDL, VLDL, LDLCALC, LDLDIRECT  Physical Exam:    VS:  BP 126/72 (BP Location: Right Arm, Patient Position: Sitting, Cuff Size: Normal)   Pulse 67   Ht 5\' 3"  (1.6 m)   Wt 169 lb (76.7 kg)   SpO2 98%   BMI 29.94 kg/m     Wt Readings from Last 3 Encounters:  05/11/18 169 lb (76.7 kg)  12/27/17 139 lb 9.6 oz (63.3 kg)  06/27/17 136 lb (61.7 kg)     GEN: Patient is in no acute distress HEENT: Normal NECK: No JVD; No carotid bruits LYMPHATICS: No lymphadenopathy CARDIAC: S1 S2 regular, 2/6 systolic murmur at the apex. RESPIRATORY:  Clear to auscultation without rales, wheezing or rhonchi  ABDOMEN: Soft, non-tender, non-distended MUSCULOSKELETAL:  No edema; No deformity  SKIN: Warm and dry NEUROLOGIC:  Alert and oriented x 3 PSYCHIATRIC:  Normal affect    Signed, Garwin Brothersajan R Revankar, MD  05/11/2018 1:50 PM    [facility]

## 2018-05-11 NOTE — Patient Instructions (Signed)
Medication Instructions:  Your physician recommends that you continue on your current medications as directed. Please refer to the Current Medication list given to you today.  If you need a refill on your cardiac medications before your next appointment, please call your pharmacy.   Lab work: None  If you have labs (blood work) drawn today and your tests are completely normal, you will receive your results only by: . MyChart Message (if you have MyChart) OR . A paper copy in the mail If you have any lab test that is abnormal or we need to change your treatment, we will call you to review the results.  Testing/Procedures: Your physician has requested that you have an echocardiogram. Echocardiography is a painless test that uses sound waves to create images of your heart. It provides your doctor with information about the size and shape of your heart and how well your heart's chambers and valves are working. This procedure takes approximately one hour. There are no restrictions for this procedure.    Follow-Up: At [facility], you and your health needs are our priority.  As part of our continuing mission to provide you with exceptional heart care, we have created designated Provider Care Teams.  These Care Teams include your primary Cardiologist (physician) and Advanced Practice Providers (APPs -  Physician Assistants and Nurse Practitioners) who all work together to provide you with the care you need, when you need it. You will need a follow up appointment in 6 months.  Please call our office 2 months in advance to schedule this appointment.  You may see No primary care provider on file. or another member of our [facility] Provider Team in [place]: Robert Krasowski, MD . Brian Munley, MD .   Any Other Special Instructions Will Be Listed Below (If Applicable).    

## 2018-05-25 ENCOUNTER — Other Ambulatory Visit: Payer: Self-pay

## 2018-05-25 DIAGNOSIS — G3184 Mild cognitive impairment, so stated: Secondary | ICD-10-CM

## 2018-05-25 DIAGNOSIS — R413 Other amnesia: Secondary | ICD-10-CM

## 2018-05-25 MED ORDER — CEREFOLIN NAC 6-2-600 MG PO TABS: 1.0000 | ORAL_TABLET | Freq: Every day | ORAL | 3 refills | Status: DC

## 2018-05-30 ENCOUNTER — Ambulatory Visit (INDEPENDENT_AMBULATORY_CARE_PROVIDER_SITE_OTHER): Payer: Medicare Other | Admitting: *Deleted

## 2018-05-30 DIAGNOSIS — I059 Rheumatic mitral valve disease, unspecified: Secondary | ICD-10-CM | POA: Diagnosis not present

## 2018-05-30 DIAGNOSIS — Z954 Presence of other heart-valve replacement: Secondary | ICD-10-CM

## 2018-05-30 DIAGNOSIS — Z7901 Long term (current) use of anticoagulants: Secondary | ICD-10-CM | POA: Diagnosis not present

## 2018-05-30 DIAGNOSIS — Z952 Presence of prosthetic heart valve: Secondary | ICD-10-CM

## 2018-05-30 LAB — POCT INR: INR: 4 — AB (ref 2.0–3.0)

## 2018-05-30 NOTE — Patient Instructions (Signed)
Description   Skip tomorrow's dose, then resume taking 6mg  everyday. Recheck in 2 weeks. Call us with any concerns or medication changes # [phone]

## 2018-06-13 ENCOUNTER — Ambulatory Visit (INDEPENDENT_AMBULATORY_CARE_PROVIDER_SITE_OTHER): Payer: Medicare Other | Admitting: *Deleted

## 2018-06-13 DIAGNOSIS — I059 Rheumatic mitral valve disease, unspecified: Secondary | ICD-10-CM

## 2018-06-13 DIAGNOSIS — Z7901 Long term (current) use of anticoagulants: Secondary | ICD-10-CM

## 2018-06-13 DIAGNOSIS — Z954 Presence of other heart-valve replacement: Secondary | ICD-10-CM

## 2018-06-13 DIAGNOSIS — Z952 Presence of prosthetic heart valve: Secondary | ICD-10-CM

## 2018-06-13 LAB — POCT INR: INR: 3.4 — AB (ref 2.0–3.0)

## 2018-06-13 NOTE — Patient Instructions (Signed)
Description   Continue  taking 6mg  everyday. Recheck in 2 weeks. Call us with any concerns or medication changes # [phone]

## 2018-06-22 ENCOUNTER — Ambulatory Visit (INDEPENDENT_AMBULATORY_CARE_PROVIDER_SITE_OTHER): Payer: Medicare Other

## 2018-06-22 DIAGNOSIS — Z954 Presence of other heart-valve replacement: Secondary | ICD-10-CM | POA: Diagnosis not present

## 2018-06-22 NOTE — Progress Notes (Signed)
Complete echocardiogram has been performed.  Jimmy Janayia Burggraf RDCS, RVT 

## 2018-06-23 ENCOUNTER — Telehealth: Payer: Self-pay

## 2018-06-23 NOTE — Telephone Encounter (Signed)
Patient called and notified of test results. 

## 2018-06-23 NOTE — Telephone Encounter (Signed)
-----   Message from Garwin Brothers, MD sent at 06/23/2018  8:10 AM EST ----- The results of the study is unremarkable. Please inform patient. I will discuss in detail at next appointment. Cc  primary care/referring physician Garwin Brothers, MD 06/23/2018 8:10 AM

## 2018-07-18 ENCOUNTER — Ambulatory Visit (INDEPENDENT_AMBULATORY_CARE_PROVIDER_SITE_OTHER): Payer: Medicare Other | Admitting: Pharmacist

## 2018-07-18 DIAGNOSIS — Z954 Presence of other heart-valve replacement: Secondary | ICD-10-CM

## 2018-07-18 DIAGNOSIS — I059 Rheumatic mitral valve disease, unspecified: Secondary | ICD-10-CM

## 2018-07-18 DIAGNOSIS — Z952 Presence of prosthetic heart valve: Secondary | ICD-10-CM | POA: Diagnosis not present

## 2018-07-18 DIAGNOSIS — Z7901 Long term (current) use of anticoagulants: Secondary | ICD-10-CM | POA: Diagnosis not present

## 2018-07-18 LAB — POCT INR: INR: 1.9 — AB (ref 2.0–3.0)

## 2018-07-18 MED ORDER — WARFARIN SODIUM 6 MG PO TABS: ORAL_TABLET | ORAL | 0 refills | Status: DC

## 2018-07-18 NOTE — Patient Instructions (Signed)
Description   Take 1.5 tablets today and tomorrow then continue  taking 6mg  everyday. Recheck in 2 weeks. Call us with any concerns or medication changes # [phone]

## 2018-08-01 ENCOUNTER — Other Ambulatory Visit: Payer: Self-pay

## 2018-08-01 ENCOUNTER — Ambulatory Visit (INDEPENDENT_AMBULATORY_CARE_PROVIDER_SITE_OTHER): Payer: Medicare Other | Admitting: *Deleted

## 2018-08-01 DIAGNOSIS — Z7901 Long term (current) use of anticoagulants: Secondary | ICD-10-CM

## 2018-08-01 DIAGNOSIS — Z952 Presence of prosthetic heart valve: Secondary | ICD-10-CM

## 2018-08-01 DIAGNOSIS — Z954 Presence of other heart-valve replacement: Secondary | ICD-10-CM | POA: Diagnosis not present

## 2018-08-01 DIAGNOSIS — I059 Rheumatic mitral valve disease, unspecified: Secondary | ICD-10-CM | POA: Diagnosis not present

## 2018-08-01 LAB — POCT INR: INR: 3.9 — AB (ref 2.0–3.0)

## 2018-08-01 NOTE — Patient Instructions (Signed)
Description   Skip tomorrow's dose,  then continue  taking 6mg  everyday. Recheck in 3 weeks. Call us with any concerns or medication changes # [phone]

## 2018-08-14 ENCOUNTER — Telehealth: Payer: Self-pay

## 2018-08-14 NOTE — Telephone Encounter (Signed)

## 2018-08-15 ENCOUNTER — Other Ambulatory Visit: Payer: Self-pay

## 2018-08-15 ENCOUNTER — Ambulatory Visit (INDEPENDENT_AMBULATORY_CARE_PROVIDER_SITE_OTHER): Payer: Medicare Other | Admitting: *Deleted

## 2018-08-15 DIAGNOSIS — Z954 Presence of other heart-valve replacement: Secondary | ICD-10-CM

## 2018-08-15 DIAGNOSIS — I059 Rheumatic mitral valve disease, unspecified: Secondary | ICD-10-CM | POA: Diagnosis not present

## 2018-08-15 DIAGNOSIS — Z952 Presence of prosthetic heart valve: Secondary | ICD-10-CM

## 2018-08-15 DIAGNOSIS — Z7901 Long term (current) use of anticoagulants: Secondary | ICD-10-CM

## 2018-08-15 LAB — POCT INR: INR: 5.5 — AB (ref 2.0–3.0)

## 2018-08-21 ENCOUNTER — Ambulatory Visit: Payer: Medicare Other | Admitting: Neurology

## 2018-08-28 ENCOUNTER — Telehealth: Payer: Self-pay

## 2018-08-28 NOTE — Telephone Encounter (Signed)
lmom for prescreen  

## 2018-08-29 ENCOUNTER — Other Ambulatory Visit: Payer: Self-pay

## 2018-08-29 ENCOUNTER — Ambulatory Visit (INDEPENDENT_AMBULATORY_CARE_PROVIDER_SITE_OTHER): Payer: Medicare Other | Admitting: *Deleted

## 2018-08-29 DIAGNOSIS — Z7901 Long term (current) use of anticoagulants: Secondary | ICD-10-CM

## 2018-08-29 DIAGNOSIS — Z954 Presence of other heart-valve replacement: Secondary | ICD-10-CM | POA: Diagnosis not present

## 2018-08-29 DIAGNOSIS — Z952 Presence of prosthetic heart valve: Secondary | ICD-10-CM | POA: Diagnosis not present

## 2018-08-29 DIAGNOSIS — I059 Rheumatic mitral valve disease, unspecified: Secondary | ICD-10-CM

## 2018-08-29 LAB — POCT INR: INR: 1.7 — AB (ref 2.0–3.0)

## 2018-08-29 NOTE — Patient Instructions (Signed)
Description   Spoke with wife and instructed pt to take 1.5 tablets today and tomorrow then continue taking 6mg  everyday except 3mg  on Sunday on Thursdays.  Recheck in 2 weeks. Call us with any concerns or medication changes # [phone]

## 2018-09-05 ENCOUNTER — Other Ambulatory Visit: Payer: Self-pay

## 2018-09-05 ENCOUNTER — Ambulatory Visit (INDEPENDENT_AMBULATORY_CARE_PROVIDER_SITE_OTHER): Payer: Medicare Other | Admitting: Neurology

## 2018-09-05 DIAGNOSIS — F0151 Vascular dementia with behavioral disturbance: Secondary | ICD-10-CM | POA: Diagnosis not present

## 2018-09-05 DIAGNOSIS — F01518 Vascular dementia, unspecified severity, with other behavioral disturbance: Secondary | ICD-10-CM

## 2018-09-05 NOTE — Progress Notes (Signed)
Virtual Visit via Video Note  I connected with Tanner Chambers on 09/05/18 at 10:30 AM EDT by a video enabled telemedicine application and verified that I am speaking with the correct person using two identifiers.  The patient was at his home with his wife who facilitated this visit.  I was in my office.   I discussed the limitations of evaluation and management by telemedicine and the availability of in person appointments. The patient expressed understanding and agreed to proceed.  History of Present Illness: This is a virtual video follow-up visit following last office visit in August 2019.  He continues to have significant cognitive issues and behavioral disturbance.  He has at times certain fixed delusions.  Recently he thinks he is not living in his house and has been packing his clothes and keeping it in the car in the garage to go to his house.  Despite being redirected by his wife this pattern of behavior persist.  His wife had called me a month ago and we had started him on Depakote ER 500 mg daily which seems to have reduced his behavioral agitation and calmed him down however the wife feels that his INR has been fluctuating in the last few weeks which may be related to Depakote.  He has not had any hallucinations but he still at times does not recognize his wife but now he can tolerate her at home.  He continues to struggle with speech and word finding and is often unable to complete sentences and has paraphasic errors.  He has good days and bad days.  He remains on Namzaric 28/10 daily and is tolerating it well.  He is also on Cerefolin.  He had a follow-up visit with his cardiologist few months ago and no medication changes were made.  The patient's wife is requesting an adult daycare referral due to caregiver burnout.  Patient had called me in October saying that he had been having a headache for 3 months.  When I spoke to his wife she stated that he had never mentioned it to her.  We obtained a CT  scan of the head at [facility] on 03/03/2018 which showed no acute findings and stable cortical atrophy unchanged from February 2019.    Observations/Objective: Limited neurological exam due to constraints from video visit.  Patient was awake alert and interactive.  He has diminished attention, registration and recall.  His speech was nonfluent with obvious word finding difficulties and occasional paraphasic errors at times he was unable to complete sentences.  Detailed cognitive testing and Mini-Mental status exam were not performed.  He appeared calm and occasionally irritable during this visit and interrupted his wife frequently.  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  Having recent behavioral changes with modest response to Depakote.  Follow Up Instructions: I had a long discussion with the patient and his wife regarding his behavioral changes and dementia.  I recommend he continue Depakote ER in the current dose of 500 mg daily and due to his fluctuations in INR I am reluctant to increase the dose further.  He will continue on the current dose of Namzaric and Cerefolin and patient's wife was educated about practical tips in managing agitation and behavioral modification.  She will also look at adult daycare to provide her some caregiver relief.  He will return for follow-up in 3 months or call earlier if necessary.   I  discussed the assessment and treatment plan with the patient. The patient was provided an opportunity to ask questions and all were answered. The patient agreed with the plan and demonstrated an understanding of the instructions.   The patient was advised to call back or seek an in-person evaluation if the symptoms worsen or if the condition fails to improve as anticipated.  I provided 25 minutes of non-face-to-face time during this  encounter.   Delia HeadyPramod Teandre Hamre, MD

## 2018-09-11 ENCOUNTER — Telehealth: Payer: Self-pay

## 2018-09-11 NOTE — Telephone Encounter (Signed)

## 2018-09-12 ENCOUNTER — Ambulatory Visit (INDEPENDENT_AMBULATORY_CARE_PROVIDER_SITE_OTHER): Payer: Medicare Other | Admitting: *Deleted

## 2018-09-12 ENCOUNTER — Other Ambulatory Visit: Payer: Self-pay

## 2018-09-12 DIAGNOSIS — Z952 Presence of prosthetic heart valve: Secondary | ICD-10-CM

## 2018-09-12 DIAGNOSIS — I059 Rheumatic mitral valve disease, unspecified: Secondary | ICD-10-CM | POA: Diagnosis not present

## 2018-09-12 DIAGNOSIS — Z7901 Long term (current) use of anticoagulants: Secondary | ICD-10-CM | POA: Diagnosis not present

## 2018-09-12 DIAGNOSIS — Z954 Presence of other heart-valve replacement: Secondary | ICD-10-CM | POA: Diagnosis not present

## 2018-09-12 LAB — POCT INR: INR: 2.1 (ref 2.0–3.0)

## 2018-09-12 NOTE — Patient Instructions (Signed)
Description   Spoke with wife and instructed pt to take 1.5 tablets today hen start taking taking 6mg  everyday except 3mg  on Thursdays.  Recheck in 2 weeks. Call us with any concerns or medication changes # [phone]

## 2018-09-25 ENCOUNTER — Telehealth: Payer: Self-pay

## 2018-09-25 NOTE — Telephone Encounter (Signed)

## 2018-09-25 NOTE — Telephone Encounter (Signed)
lmom for prescreen  

## 2018-09-26 ENCOUNTER — Other Ambulatory Visit: Payer: Self-pay

## 2018-09-26 ENCOUNTER — Ambulatory Visit (INDEPENDENT_AMBULATORY_CARE_PROVIDER_SITE_OTHER): Payer: Medicare Other | Admitting: *Deleted

## 2018-09-26 DIAGNOSIS — Z954 Presence of other heart-valve replacement: Secondary | ICD-10-CM

## 2018-09-26 DIAGNOSIS — Z7901 Long term (current) use of anticoagulants: Secondary | ICD-10-CM

## 2018-09-26 DIAGNOSIS — I059 Rheumatic mitral valve disease, unspecified: Secondary | ICD-10-CM | POA: Diagnosis not present

## 2018-09-26 DIAGNOSIS — Z952 Presence of prosthetic heart valve: Secondary | ICD-10-CM

## 2018-09-26 LAB — POCT INR: INR: 2.3 (ref 2.0–3.0)

## 2018-10-06 ENCOUNTER — Telehealth: Payer: Self-pay

## 2018-10-06 NOTE — Telephone Encounter (Signed)

## 2018-10-07 ENCOUNTER — Other Ambulatory Visit: Payer: Self-pay | Admitting: Neurology

## 2018-10-07 ENCOUNTER — Other Ambulatory Visit: Payer: Self-pay | Admitting: Cardiology

## 2018-10-10 ENCOUNTER — Other Ambulatory Visit: Payer: Self-pay

## 2018-10-10 ENCOUNTER — Ambulatory Visit (INDEPENDENT_AMBULATORY_CARE_PROVIDER_SITE_OTHER): Payer: Medicare Other | Admitting: *Deleted

## 2018-10-10 DIAGNOSIS — Z952 Presence of prosthetic heart valve: Secondary | ICD-10-CM | POA: Diagnosis not present

## 2018-10-10 DIAGNOSIS — I059 Rheumatic mitral valve disease, unspecified: Secondary | ICD-10-CM

## 2018-10-10 DIAGNOSIS — Z954 Presence of other heart-valve replacement: Secondary | ICD-10-CM

## 2018-10-10 DIAGNOSIS — N401 Enlarged prostate with lower urinary tract symptoms: Secondary | ICD-10-CM | POA: Diagnosis not present

## 2018-10-10 DIAGNOSIS — Z7901 Long term (current) use of anticoagulants: Secondary | ICD-10-CM | POA: Diagnosis not present

## 2018-10-10 DIAGNOSIS — N3090 Cystitis, unspecified without hematuria: Secondary | ICD-10-CM | POA: Diagnosis not present

## 2018-10-10 LAB — POCT INR: INR: 2.9 (ref 2.0–3.0)

## 2018-10-10 MED ORDER — DIVALPROEX SODIUM ER 500 MG PO TB24: 500.0000 mg | ORAL_TABLET | Freq: Every day | ORAL | 1 refills | Status: DC

## 2018-10-10 NOTE — Patient Instructions (Signed)
Description   Spoke with wife and instructed to continue taking taking 6mg  everyday.   Recheck in 2 weeks. Call us with any concerns or medication changes # [phone]

## 2018-10-17 ENCOUNTER — Telehealth: Payer: Self-pay | Admitting: *Deleted

## 2018-10-17 MED ORDER — WARFARIN SODIUM 6 MG PO TABS: ORAL_TABLET | ORAL | 0 refills | Status: DC

## 2018-10-17 NOTE — Telephone Encounter (Signed)
Called pt wife to clarify request as patient is on 6mg . Wife states she has a bottle of 5mg  at home so she wanted 1mg  to make 6mg . However when I asked her to look at the bottle the experation date was 06/11/2018 therefore I advised she should not use. I will send an Rx to [facility] for 6mg  tablets (was sent 5/26, but I will send again since she says she hasn't received)

## 2018-10-17 NOTE — Telephone Encounter (Signed)
*  STAT* If patient is at the pharmacy, call can be transferred to refill team.   1. Which medications need to be refilled? (please list name of each medication and dose if known) Coumadin 1 mg  2. Which pharmacy/location (including street and city if local pharmacy) is medication to be sent to?[facility]  3. Do they need a 30 day or 90 day supply? 90

## 2018-10-19 ENCOUNTER — Telehealth: Payer: Self-pay

## 2018-10-19 NOTE — Telephone Encounter (Signed)
lmom for prescreen in office aware 

## 2018-10-24 ENCOUNTER — Other Ambulatory Visit: Payer: Self-pay

## 2018-10-24 ENCOUNTER — Ambulatory Visit (INDEPENDENT_AMBULATORY_CARE_PROVIDER_SITE_OTHER): Payer: Medicare Other | Admitting: *Deleted

## 2018-10-24 DIAGNOSIS — I059 Rheumatic mitral valve disease, unspecified: Secondary | ICD-10-CM | POA: Diagnosis not present

## 2018-10-24 DIAGNOSIS — Z952 Presence of prosthetic heart valve: Secondary | ICD-10-CM | POA: Diagnosis not present

## 2018-10-24 DIAGNOSIS — Z7901 Long term (current) use of anticoagulants: Secondary | ICD-10-CM

## 2018-10-24 DIAGNOSIS — Z954 Presence of other heart-valve replacement: Secondary | ICD-10-CM

## 2018-10-24 LAB — POCT INR: INR: 1.6 — AB (ref 2.0–3.0)

## 2018-10-24 NOTE — Patient Instructions (Signed)
Description   Spoke with wife and patient, will take 9mg  today and tomorrow then  continue taking taking 6mg  everyday.   Recheck in 2 weeks. Call us with any concerns or medication changes # [phone]

## 2018-10-25 ENCOUNTER — Other Ambulatory Visit: Payer: Self-pay | Admitting: Neurology

## 2018-10-25 DIAGNOSIS — F0280 Dementia in other diseases classified elsewhere without behavioral disturbance: Secondary | ICD-10-CM

## 2018-11-06 ENCOUNTER — Telehealth: Payer: Self-pay

## 2018-11-06 NOTE — Telephone Encounter (Signed)

## 2018-11-10 ENCOUNTER — Other Ambulatory Visit: Payer: Self-pay

## 2018-11-10 ENCOUNTER — Ambulatory Visit (INDEPENDENT_AMBULATORY_CARE_PROVIDER_SITE_OTHER): Payer: Medicare Other | Admitting: *Deleted

## 2018-11-10 DIAGNOSIS — I059 Rheumatic mitral valve disease, unspecified: Secondary | ICD-10-CM

## 2018-11-10 DIAGNOSIS — Z954 Presence of other heart-valve replacement: Secondary | ICD-10-CM

## 2018-11-10 DIAGNOSIS — Z7901 Long term (current) use of anticoagulants: Secondary | ICD-10-CM | POA: Diagnosis not present

## 2018-11-10 DIAGNOSIS — Z952 Presence of prosthetic heart valve: Secondary | ICD-10-CM

## 2018-11-10 LAB — POCT INR: INR: 4.1 — AB (ref 2.0–3.0)

## 2018-11-10 MED ORDER — WARFARIN SODIUM 6 MG PO TABS: ORAL_TABLET | ORAL | 0 refills | Status: DC

## 2018-11-10 NOTE — Patient Instructions (Signed)
Description   Skip today's dose, then  continue taking taking 6mg  everyday.   Recheck in 2 weeks. Call us with any concerns or medication changes # [phone]

## 2018-11-21 ENCOUNTER — Telehealth: Payer: Self-pay

## 2018-11-21 NOTE — Telephone Encounter (Signed)

## 2018-11-27 DIAGNOSIS — E785 Hyperlipidemia, unspecified: Secondary | ICD-10-CM | POA: Diagnosis not present

## 2018-11-27 DIAGNOSIS — K219 Gastro-esophageal reflux disease without esophagitis: Secondary | ICD-10-CM | POA: Diagnosis not present

## 2018-11-27 DIAGNOSIS — R110 Nausea: Secondary | ICD-10-CM | POA: Diagnosis not present

## 2018-11-27 DIAGNOSIS — D696 Thrombocytopenia, unspecified: Secondary | ICD-10-CM | POA: Diagnosis not present

## 2018-11-28 ENCOUNTER — Ambulatory Visit (INDEPENDENT_AMBULATORY_CARE_PROVIDER_SITE_OTHER): Payer: Medicare Other | Admitting: *Deleted

## 2018-11-28 ENCOUNTER — Other Ambulatory Visit: Payer: Self-pay

## 2018-11-28 DIAGNOSIS — Z954 Presence of other heart-valve replacement: Secondary | ICD-10-CM

## 2018-11-28 DIAGNOSIS — Z7901 Long term (current) use of anticoagulants: Secondary | ICD-10-CM

## 2018-11-28 DIAGNOSIS — Z952 Presence of prosthetic heart valve: Secondary | ICD-10-CM | POA: Diagnosis not present

## 2018-11-28 DIAGNOSIS — I059 Rheumatic mitral valve disease, unspecified: Secondary | ICD-10-CM | POA: Diagnosis not present

## 2018-11-28 DIAGNOSIS — D696 Thrombocytopenia, unspecified: Secondary | ICD-10-CM | POA: Diagnosis not present

## 2018-11-28 DIAGNOSIS — E785 Hyperlipidemia, unspecified: Secondary | ICD-10-CM | POA: Diagnosis not present

## 2018-11-28 LAB — POCT INR: INR: 3.6 — AB (ref 2.0–3.0)

## 2018-11-28 NOTE — Patient Instructions (Signed)
Description   Change dose to 6mg  daily except 3mg  on Tuesdays.   Recheck in 2 weeks. Call us with any concerns or medication changes # [phone]

## 2018-12-12 ENCOUNTER — Ambulatory Visit (INDEPENDENT_AMBULATORY_CARE_PROVIDER_SITE_OTHER): Payer: Medicare Other | Admitting: *Deleted

## 2018-12-12 ENCOUNTER — Other Ambulatory Visit: Payer: Self-pay

## 2018-12-12 ENCOUNTER — Ambulatory Visit: Payer: Medicare Other | Admitting: Neurology

## 2018-12-12 DIAGNOSIS — Z954 Presence of other heart-valve replacement: Secondary | ICD-10-CM

## 2018-12-12 DIAGNOSIS — Z7901 Long term (current) use of anticoagulants: Secondary | ICD-10-CM | POA: Diagnosis not present

## 2018-12-12 DIAGNOSIS — Z952 Presence of prosthetic heart valve: Secondary | ICD-10-CM

## 2018-12-12 DIAGNOSIS — I059 Rheumatic mitral valve disease, unspecified: Secondary | ICD-10-CM

## 2018-12-12 LAB — POCT INR: INR: 4.6 — AB (ref 2.0–3.0)

## 2018-12-12 NOTE — Patient Instructions (Addendum)
Description   Pt takes Coumadin in AM. Skip tomorrow's dose, then change your dose to 1 tablet daily except 1/2 tablet on Tuesdays, Thursdays and Saturdays.  Recheck in 2 weeks. Call us with any concerns or medication changes # [phone]

## 2018-12-19 DIAGNOSIS — E785 Hyperlipidemia, unspecified: Secondary | ICD-10-CM | POA: Diagnosis not present

## 2018-12-19 DIAGNOSIS — Z9181 History of falling: Secondary | ICD-10-CM | POA: Diagnosis not present

## 2018-12-19 DIAGNOSIS — Z1331 Encounter for screening for depression: Secondary | ICD-10-CM | POA: Diagnosis not present

## 2018-12-19 DIAGNOSIS — Z0000 Encounter for general adult medical examination without abnormal findings: Secondary | ICD-10-CM | POA: Diagnosis not present

## 2018-12-19 DIAGNOSIS — Z139 Encounter for screening, unspecified: Secondary | ICD-10-CM | POA: Diagnosis not present

## 2018-12-26 ENCOUNTER — Ambulatory Visit (INDEPENDENT_AMBULATORY_CARE_PROVIDER_SITE_OTHER): Payer: Medicare Other | Admitting: *Deleted

## 2018-12-26 DIAGNOSIS — Z952 Presence of prosthetic heart valve: Secondary | ICD-10-CM

## 2018-12-26 DIAGNOSIS — I059 Rheumatic mitral valve disease, unspecified: Secondary | ICD-10-CM

## 2018-12-26 DIAGNOSIS — Z7901 Long term (current) use of anticoagulants: Secondary | ICD-10-CM

## 2018-12-26 DIAGNOSIS — Z954 Presence of other heart-valve replacement: Secondary | ICD-10-CM

## 2018-12-26 LAB — POCT INR: INR: 2.1 (ref 2.0–3.0)

## 2018-12-26 NOTE — Patient Instructions (Signed)
Description   Today take 1 tablet, then continue taking 1 tablet daily except 1/2 tablet on Tuesdays, Thursdays and Saturdays.  Recheck in 2 weeks. Call us with any concerns or medication changes # [phone]

## 2019-01-09 ENCOUNTER — Ambulatory Visit (INDEPENDENT_AMBULATORY_CARE_PROVIDER_SITE_OTHER): Payer: Medicare Other | Admitting: *Deleted

## 2019-01-09 ENCOUNTER — Other Ambulatory Visit: Payer: Self-pay

## 2019-01-09 DIAGNOSIS — I059 Rheumatic mitral valve disease, unspecified: Secondary | ICD-10-CM | POA: Diagnosis not present

## 2019-01-09 DIAGNOSIS — Z952 Presence of prosthetic heart valve: Secondary | ICD-10-CM

## 2019-01-09 DIAGNOSIS — Z7901 Long term (current) use of anticoagulants: Secondary | ICD-10-CM

## 2019-01-09 DIAGNOSIS — Z954 Presence of other heart-valve replacement: Secondary | ICD-10-CM

## 2019-01-09 LAB — POCT INR: INR: 2.3 (ref 2.0–3.0)

## 2019-01-09 NOTE — Patient Instructions (Signed)
Description   Today take 1 tablet, then start taking 1 tablet daily except 1/2 tablet on Tuesdays  and Saturdays.  Recheck in 2 weeks. Call us with any concerns or medication changes # [phone]

## 2019-01-23 ENCOUNTER — Ambulatory Visit (INDEPENDENT_AMBULATORY_CARE_PROVIDER_SITE_OTHER): Payer: Medicare Other | Admitting: *Deleted

## 2019-01-23 ENCOUNTER — Other Ambulatory Visit: Payer: Self-pay

## 2019-01-23 DIAGNOSIS — Z7901 Long term (current) use of anticoagulants: Secondary | ICD-10-CM | POA: Diagnosis not present

## 2019-01-23 DIAGNOSIS — Z952 Presence of prosthetic heart valve: Secondary | ICD-10-CM | POA: Diagnosis not present

## 2019-01-23 DIAGNOSIS — Z954 Presence of other heart-valve replacement: Secondary | ICD-10-CM

## 2019-01-23 DIAGNOSIS — I059 Rheumatic mitral valve disease, unspecified: Secondary | ICD-10-CM | POA: Diagnosis not present

## 2019-01-23 LAB — POCT INR: INR: 2.7 (ref 2.0–3.0)

## 2019-01-23 NOTE — Patient Instructions (Signed)
Description   Continue  taking 1 tablet daily except 1/2 tablet on Tuesdays  and Saturdays.  Recheck in 2 weeks. Call us with any concerns or medication changes # [phone]

## 2019-01-30 DIAGNOSIS — Z23 Encounter for immunization: Secondary | ICD-10-CM | POA: Diagnosis not present

## 2019-02-06 ENCOUNTER — Ambulatory Visit (INDEPENDENT_AMBULATORY_CARE_PROVIDER_SITE_OTHER): Payer: Medicare Other | Admitting: Pharmacist Clinician (PhC)/ Clinical Pharmacy Specialist

## 2019-02-06 ENCOUNTER — Other Ambulatory Visit: Payer: Self-pay

## 2019-02-06 DIAGNOSIS — Z952 Presence of prosthetic heart valve: Secondary | ICD-10-CM

## 2019-02-06 DIAGNOSIS — I059 Rheumatic mitral valve disease, unspecified: Secondary | ICD-10-CM

## 2019-02-06 DIAGNOSIS — D689 Coagulation defect, unspecified: Secondary | ICD-10-CM

## 2019-02-06 DIAGNOSIS — Z7901 Long term (current) use of anticoagulants: Secondary | ICD-10-CM | POA: Diagnosis not present

## 2019-02-06 DIAGNOSIS — Z954 Presence of other heart-valve replacement: Secondary | ICD-10-CM

## 2019-02-06 LAB — POCT INR: INR: 2.9 (ref 2.0–3.0)

## 2019-02-22 ENCOUNTER — Ambulatory Visit (INDEPENDENT_AMBULATORY_CARE_PROVIDER_SITE_OTHER): Payer: Medicare Other | Admitting: Neurology

## 2019-02-22 ENCOUNTER — Encounter: Payer: Self-pay | Admitting: Neurology

## 2019-02-22 ENCOUNTER — Other Ambulatory Visit: Payer: Self-pay

## 2019-02-22 VITALS — BP 127/90 | HR 80 | Temp 98.3°F | Wt 130.0 lb

## 2019-02-22 DIAGNOSIS — G301 Alzheimer's disease with late onset: Secondary | ICD-10-CM | POA: Diagnosis not present

## 2019-02-22 DIAGNOSIS — F0281 Dementia in other diseases classified elsewhere with behavioral disturbance: Secondary | ICD-10-CM

## 2019-02-22 NOTE — Progress Notes (Signed)
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 The family has noticed progressive memory loss and mild cognitive difficulties over the last 1 year stroke. He has been increasingly forgetful in locking the house or turning of the lights when he goes out. He also has trouble remembering names of people but he can remember this after a while. He does also have mild anxiety which seems to have increased recently but this is not any medications for that.He has bilateral hearing loss since last 5 years following complications of gentamicin usage for treatment for mitral valve endocarditis with prolonged antibiotics 5 years ago. He underwent mitral valve replacement at that time and has been on warfarin since then with a relatively stable INR. He denies any history of strokes, TIAs, speech problems, gait or balance problems. There have been no significant behavioral issues, hallucinations, delusions, significant headache noted. His gait and balance seem fine and there is no fall or safety risks identified. There is no prior history of seizures, significant head injury or loss of consciousness. Is no family history of dementia. He has not had any evaluation for treatable causes of memory loss or brain imaging done recently.he remains independent with activities of daily living and is able to take care of his own needs.  Update 1/8/2016PS : He returns for follow-up today after his last visit 3 months ago accompanied by his son. He has noticed improvement in his memory as well as concentration and cognitive difficulties. He has been taking Cerefolin and reservetrol every day . He also participates in cognitively challenging activities. He recently traveled to [place] for a month and  had slight difficulties in coping. He forgot his INR machine. He remains independent with activities of daily living and does not need help with any activities. He has no neuro neurological complaints. He underwent lab work for treatable causes of cognitive impairment which was negative. EEG showed no seizure activity. MRI scan of the brain personally reviewed shows mild changes of chronic microvascular ischemia and microhemorrhages  Update 11/22/2014 PS: He returns for follow-up after last visit 6 months ago. Is accompanied by his wife and son who contributed to the history. They have noticed cognitive decline since the last visit With the patient having good days and bad days and being confused and disoriented particularly when he has to learn some new activity in task. At times he is had trouble recognizing familiar faces on one occasion he had trouble coming up with his daughter's name. He was seen by his primary physician recently who gave him a sample pack of Namenda to try but he has not started that yet. He denies any fall, injury, focal symptoms suggestive of a stroke in the form of speech difficulties extremity weakness numbness gait or balance problems. He remains on warfarin which is tolerating well with INR being fairly stable. Update 12/20/2014 PS: He returns for follow-up after last visit 1 month ago. He initially reported some side effects on Namenda in the form of cough and feeling strange but that seems to have settled down. He was having some bad dreams initially and that also seems to be improving. He feels overall he is quite well during the day with better memory and concentration. However at nights he is restless and  often wakes up early in the morning. He does Nap for 10-15 minutes during the daytime but does manage to sleep 5-6 hours every night. He states his INR has been quite stable and he is on a fairly regular dose of warfarin. On Mini-Mental status exam today scored 27/30 which is  much improved from 22/30 at last visit.  11/9/2016PS : He returns for follow-up to last visit 3 months ago. Is accompanied by his wife. Patient continues to have noticed improvement in his memory and cognition after taking Namenda XR 28 mg daily. He keeps himself busy by solving crossword puzzles, doing sudoku and computer brain games. He had recent eye surgery for blocked lacrimal ducts which went well. He does have some bruises from the surgery on his periorbital region. The patient has several questions about his driving as well as ability to travel alone to visit his son in [place]. On Mini-Mental status exam today scored 25/30 which is not significantly changed from 27/30 at last visit. He was able to name 14 four legged animals and clock drawing score was 3/4.He is sleeping better. And has no complaints UPDATE 05/10/2017CM Patient returns for six-month follow-up with his wife for memory loss. He and his wife both think his memory is about the same. He is taking Namenda XR 28 mg daily. He continues to do games on the computer. He continues to drive short distances in familiar places without getting loss. He has no new complaints.  UPDATE 11/10/17CM Mr [name], 83 year old male returns for follow-up with his wife. He has a history of memory loss. The patient thinks his memory is about the same, the wife thinks he is more forgetful. His score is less than it was at his last visit. He is currently on Namenda XR 28  mg daily. He has never been on Aricept. He continues to drive short distances and familiar places without getting lost however the wife tells me today  sometimes she is afraid to ride with him. He has also had a sporadic hallucination at night several times since last seen. It sounds more like shadows in the dark to me on description. No other medical issues since last seen. He has significant hearing loss. He returns for reevaluation.  Update 06/30/2016 : He returns for follow-up after last  visit 3 months ago. He has tolerated Aricept 5 mg daily without any GI or CNS side effects. There has been some improvement in his memory and cognition but patient's wife states that particularly patient is unable to recognize only her. He has no trouble recognizing other family members and friends. Patient has still been driving but wife is concerned about his safety about doing so. Patient remains on Coumadin with INR being fairly stable. He has I  had no stroke or TIA symptoms. There have been no unsafe behavior or trouble with   agitation. There have been not many delusions or hallucinations. He has no trouble with walking and balance and there have been no falls Update 12/15/2016 : He returns for follow-up after last visit 6 months ago. Is accompanied by his wife. He has been able to tolerate Namzaric well without any obvious side effects and in fact both of them feel there may be some cognitive stabilization. He is not able to recognize his wife mostly does get occasionally confused. He still needs close supervision. He is still driving but has agreed to drive on the with his wife next to him. He has had no episodes of  getting lost any agitation or violent behavior. There have been no hallucinations or delusions. He has unfortunately lost his hearing aid 2 days ago and has having trouble hearing. Despite this he did better on the Mini-Mental testing in office today compared to last visit. Patient in pounds with a few weeks ago he had minor fall when he hit the back of his head but did not lose consciousness. There was no bleeding or swelling noted in his scalp. On exam today there is no tenderness or bruises noted by me. He denies any headache, gait or balance difficulties or neurological change after that fall. He remains on warfarin which is tolerating well and his INR has been stable. Update 06/27/2017 ; he returns for follow-up after last of that 6 months ago. Is accompanied by his wife. She states that his  remaining more or less the same cognitively. Does have periods of intermittent confusion and disorientation. At times he still does not recognize his wife. He can be redirected easily. There has been no significant agitation or violent behavior. This mentions a delusions. There are no unsafe behaviors. He does not wander out. He does socialize still quite a bit but his wife is always with her. Is tolerating warfarin well without bleeding or bruising. He is scheduled to have a follow-up echocardiogram by his cardiologist next week. He has decided to participate in the trachea visit dementia trial and had study specific screening MRI done last week which have personally reviewed shows no significant change from prior MRI 3 years ago. On Mini-Mental status testing today scored 21/30 which is slightly low from 23/30 from last visit. He remains on nonselective which is tolerating well without any side effects. He also takes Resevetarol and cerfolin nac:  Update 12/27/2017 : The patient returns for scheduled 6 monthly follow-up visit. His wife informs me that he had an episode of agitated delirium a few weeks ago. Patient had taken a few doses of clindamycin for a presumed dental surgery which eventually had to be postponed since his INR was quite elevated. The patient forcibly took the keys to the car and drove and apparently got disoriented and lost but eventually was able to make his way back home. He was also quite agitated and restless for couple of days. He called up his brother-in-law and express some desire to end it all. The patient however has improved since then. He seems to be his usual self as per his wife for the last few weeks. On the depression screening questionnaire today in the office he scored only 2 periods on the Mini-Mental status he showed a decline in his score of 16/30 compared to previous scored 21/30 but the patient and wife both felt that he did not understand the questions due to his hearing  difficulty. The patient remains on them so they consider full and pain. He's had no prior history of depression on medications for that. The patient was screened for   for the Trailblazer dementia study but failed to qualify due to having multiple cerebral microhemorrhages on MRI.  Update 09/05/18 : This is a virtual video follow-up visit following last office visit in August 2019.  He continues to have significant cognitive issues and behavioral disturbance.  He has at times certain fixed delusions.  Recently he thinks he is not living in his house and has been packing his clothes and keeping it in the car in the garage to go to his house.  Despite being redirected by his  wife this pattern of behavior persist.  His wife had called me a month ago and we had started him on Depakote ER 500 mg daily which seems to have reduced his behavioral agitation and calmed him down however the wife feels that his INR has been fluctuating in the last few weeks which may be related to Depakote.  He has not had any hallucinations but he still at times does not recognize his wife but now he can tolerate her at home.  He continues to struggle with speech and word finding and is often unable to complete sentences and has paraphasic errors.  He has good days and bad days.  He remains on Namzaric 28/10 daily and is tolerating it well.  He is also on Cerefolin.  He had a follow-up visit with his cardiologist few months ago and no medication changes were made.  The patient's wife is requesting an adult daycare referral due to caregiver burnout.  Patient had called me in October saying that he had been having a headache for 3 months.  When I spoke to his wife she stated that he had never mentioned it to her.  We obtained a CT scan of the head at [facility] on 03/03/2018 which showed no acute findings and stable cortical atrophy unchanged from February 2019.  Update 02/22/2019 : He returns for f/u after last visit 6 months ago. He  is accompanied by his wife.  Patient continues to have significant cognitive difficulties and some occasional behavioral and interaction issues.  Wife states that he is more interested in any social interaction or activities anymore.  His son who is visiting there is concerned that he may be depressed.  Patient has been eating well.  He may have lost some weight.  His INR has been fairly stable.  He at times gets agitated and screams but there has not been any violent behavior.  There have been no delusions or hallucinations.  He often forgets his own home recognizing family members.  He has had a couple of minor falls but no major injuries.  On Mini-Mental status exam today he scored 11/30 which is little decline from 16/30 from a year ago.  And depression scale he scored only 2 which is not suggestive of depression.  He remains on Depakote ER 500 mg which she seems to be tolerating well without side effects.  heis also on Namzaric and Cerefolin NAC.  He unfortunately does not qualify for the Trailblazer dementia studies because of presence of greater than 4 microhemorrhages on his last MRI   REVIEW OF SYSTEMS: Full 14 system review of systems performed and notable only for those listed, all others are neg:  Memory loss, hearing loss, confusion , ringing in the ears, runny nose, bladder urgency, frequency of urination, agitation, depression, hallucinations, snoring and all other systems negative ALLERGIES: Allergies  Allergen Reactions   Ampicillin Other (See Comments)    Ask Patient   Niacin Other (See Comments)    Gout   Penicillins     HOME MEDICATIONS: Outpatient Medications Prior to Visit  Medication Sig Dispense Refill   atorvastatin (LIPITOR) 80 MG tablet Take 80 mg by mouth daily.     divalproex (DEPAKOTE ER) 500 MG 24 hr tablet Take 1 tablet (500 mg total) by mouth daily. 90 tablet 1   Melatonin 5 MG TABS Take by mouth. Take two at night     Methylfol-Methylcob-Acetylcyst  (CEREFOLIN NAC) 6-2-600 MG TABS Take 1 tablet by mouth daily.  90 each 3   metoprolol tartrate (LOPRESSOR) 25 MG tablet Take 12.5 mg by mouth daily.      Multiple Vitamin (MULTIVITAMIN) capsule Take 1 capsule by mouth daily.     NAMZARIC 28-10 MG CP24 TAKE 1 CAPSULE BY MOUTH  DAILY 90 capsule 1   ondansetron (ZOFRAN) 4 MG tablet Take 4 mg by mouth every 8 (eight) hours as needed for nausea or vomiting.     Pantoprazole Sodium (PROTONIX PO) Take by mouth.     warfarin (COUMADIN) 6 MG tablet TAKE 1 TABLET BY MOUTH  DAILY OR AS DIRECTED BY  COUMADIN CLINIC 100 tablet 0   NAMZARIC 28-10 MG CP24      No facility-administered medications prior to visit.     PAST MEDICAL HISTORY: Past Medical History:  Diagnosis Date   Arthritis    Coronary artery disease    Hyperlipemia    Hypertension    Memory loss     PAST SURGICAL HISTORY: Past Surgical History:  Procedure Laterality Date   CORONARY ARTERY BYPASS GRAFT     EYE SURGERY     both cataracts   MITRAL VALVE SURGERY     PROSTATE BIOPSY  2002   SHOULDER ARTHROSCOPY     right   SHOULDER ARTHROSCOPY W/ ROTATOR CUFF REPAIR  2002   left-dsc   TONSILLECTOMY     TRIGGER FINGER RELEASE Left 11/07/2012   Procedure: RELEASE TRIGGER FINGER/A-1 PULLEY LEFT RING;  Surgeon: Wyn Forsterobert V Sypher Jr., MD;  Location: [facility];  Service: Orthopedics;  Laterality: Left;    FAMILY HISTORY: History reviewed. No pertinent family history.  SOCIAL HISTORY: Social History   Socioeconomic History   Marital status: Married    Spouse name: Not on file   Number of children: 3   Years of education: MD   Highest education level: Not on file  Occupational History   Not on file  Social Needs   Financial resource strain: Not on file   Food insecurity    Worry: Not on file    Inability: Not on file   Transportation needs    Medical: Not on file    Non-medical: Not on file  Tobacco Use   Smoking status: Never  Smoker   Smokeless tobacco: Never Used  Substance and Sexual Activity   Alcohol use: Yes    Alcohol/week: 0.0 standard drinks    Comment: occ   Drug use: Yes    Types: Hydrocodone   Sexual activity: Not on file  Lifestyle   Physical activity    Days per week: Not on file    Minutes per session: Not on file   Stress: Not on file  Relationships   Social connections    Talks on phone: Not on file    Gets together: Not on file    Attends religious service: Not on file    Active member of club or organization: Not on file    Attends meetings of clubs or organizations: Not on file    Relationship status: Not on file   Intimate partner violence    Fear of current or ex partner: Not on file    Emotionally abused: Not on file    Physically abused: Not on file    Forced sexual activity: Not on file  Other Topics Concern   Not on file  Social History Narrative   Patient is married with 3 children.   Patient is right handed.   Patient is a AdministratorMedical  Doctor.   Patient drinks 2 cups daily.     PHYSICAL EXAM  Vitals:   02/22/19 1051  BP: 127/90  Pulse: 80  Temp: 98.3 F (36.8 C)  Weight: 59 kg   Body mass index is 23.03 kg/m. General: well developed, well nourished, elderly  Asian [place] male seated, in no evident distress Head: head normocephalic and atraumatic.  Neck: supple with no carotid  bruits Cardiovascular: regular rate and rhythm, no murmurs Musculoskeletal: no deformity Skin: no rash/petichiae.  Vascular: Normal pulses all extremities  Neurological examination   Mentation:Awake and  alert. . On Mini-Mental status exam today he scored 16/30 with deficits in orientation, recall, repetition, writing and drawing. He was able to name 10 animals with 4 legs. On geriatric depression scale he scored only 2 not suggestive of significant depression MMSE - Mini Mental State Exam 02/22/2019 12/27/2017 06/27/2017  Orientation to time 0 3 2  Orientation to Place Registration Attention/ Calculation 0 2 2  Recall 0 1 3  Language- name 2 objects Language- repeat 0 0 0  Language- follow 3 step command Language- read & follow direction Write a sentence 0 0 0  Copy design 0 0 1  Total score Cranial Nerves:  Pupils equal, briskly reactive to light. Extraocular movements full without nystagmus. Visual fields full to confrontation. Hearing reduced bilaterallyt. Facial sensation intact. Face, tongue, palate moves normally and symmetrically.  Motor: Normal bulk and tone. Normal strength in all tested extremity muscles. Sensory.: intact to touch , pinprick , position and vibratory sensation.  Coordination: Rapid alternating movements normal in all extremities. Finger-to-nose and heel-to-shin performed accurately bilaterally. Gait and Station: Arises from chair without difficulty. Stance is normal. Gait demonstrates normal stride length and balance . Unable to heel, toe and tandem walk without difficulty. No assistive device Reflexes: 1+ and symmetric. Toes downgoing.    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 On depression testing he is not significantly depressed. I recommend he continue Depakote ER in the current dose of 500 mg daily and due to his fluctuations in INR I am reluctant to increase the dose further.  He will continue on the current dose of Namzaric and Cerefolin and patient's wife was educated about practical tips in managing agitation and behavioral modification.    He will return for follow-up in 6 months or call  earlier if necessary..Greater than 50% time during this 30 minute visit was spent on counseling and coordination of care about his dementia and discussion of available treatment options,availibility of clinical trials and his driving ability and answering questions   Delia Heady, MD [facility] [address], [address] [place], [place] [address] [phone]

## 2019-02-22 NOTE — Patient Instructions (Signed)
I had a long discussion with the patient and his wife regarding his behavioral changes and dementia.Unfortunately he has severe dementia and does not qualify at present for any ongoing trials that I have. On depression testing he is not significantly depressed. I recommend he continue Depakote ER in the current dose of 500 mg daily and due to his fluctuations in INR I am reluctant to increase the dose further.  He will continue on the current dose of Namzaric and Cerefolin and patient's wife was educated about practical tips in managing agitation and behavioral modification.    He will return for follow-up in 6 months or call earlier if necessary.

## 2019-03-06 ENCOUNTER — Other Ambulatory Visit: Payer: Self-pay

## 2019-03-06 ENCOUNTER — Ambulatory Visit (INDEPENDENT_AMBULATORY_CARE_PROVIDER_SITE_OTHER): Payer: Medicare Other | Admitting: Pharmacist Clinician (PhC)/ Clinical Pharmacy Specialist

## 2019-03-06 DIAGNOSIS — Z954 Presence of other heart-valve replacement: Secondary | ICD-10-CM

## 2019-03-06 DIAGNOSIS — I059 Rheumatic mitral valve disease, unspecified: Secondary | ICD-10-CM | POA: Diagnosis not present

## 2019-03-06 DIAGNOSIS — Z7901 Long term (current) use of anticoagulants: Secondary | ICD-10-CM

## 2019-03-06 DIAGNOSIS — Z952 Presence of prosthetic heart valve: Secondary | ICD-10-CM

## 2019-03-06 LAB — POCT INR: INR: 3.1 — AB (ref 2.0–3.0)

## 2019-03-06 NOTE — Patient Instructions (Signed)
Continue  taking 1 tablet daily except 1/2 tablet on Tuesdays  and Saturdays.  Recheck in 6 weeks. Call us with any concerns or medication changes # [phone]

## 2019-03-07 ENCOUNTER — Other Ambulatory Visit: Payer: Self-pay | Admitting: Neurology

## 2019-04-10 ENCOUNTER — Other Ambulatory Visit: Payer: Self-pay | Admitting: Neurology

## 2019-04-10 DIAGNOSIS — G3184 Mild cognitive impairment, so stated: Secondary | ICD-10-CM

## 2019-04-10 DIAGNOSIS — R413 Other amnesia: Secondary | ICD-10-CM

## 2019-04-17 ENCOUNTER — Other Ambulatory Visit: Payer: Self-pay

## 2019-04-17 ENCOUNTER — Ambulatory Visit (INDEPENDENT_AMBULATORY_CARE_PROVIDER_SITE_OTHER): Payer: Medicare Other | Admitting: Pharmacist

## 2019-04-17 DIAGNOSIS — Z7901 Long term (current) use of anticoagulants: Secondary | ICD-10-CM | POA: Diagnosis not present

## 2019-04-17 DIAGNOSIS — Z954 Presence of other heart-valve replacement: Secondary | ICD-10-CM | POA: Diagnosis not present

## 2019-04-17 DIAGNOSIS — I059 Rheumatic mitral valve disease, unspecified: Secondary | ICD-10-CM | POA: Diagnosis not present

## 2019-04-17 DIAGNOSIS — Z952 Presence of prosthetic heart valve: Secondary | ICD-10-CM

## 2019-04-17 LAB — POCT INR: INR: 4.7 — AB (ref 2.0–3.0)

## 2019-04-17 NOTE — Patient Instructions (Signed)
Skip coumadin today, take 1/2 tablet tomorrow then continue  taking 1 tablet daily except 1/2 tablet on Tuesdays  and Saturdays.  Recheck in 4 weeks. Call us with any concerns or medication changes # [phone]

## 2019-05-01 ENCOUNTER — Ambulatory Visit (INDEPENDENT_AMBULATORY_CARE_PROVIDER_SITE_OTHER): Payer: Medicare Other | Admitting: Cardiology

## 2019-05-01 ENCOUNTER — Other Ambulatory Visit: Payer: Self-pay

## 2019-05-01 ENCOUNTER — Encounter: Payer: Self-pay | Admitting: Cardiology

## 2019-05-01 VITALS — BP 128/70 | HR 68 | Wt 128.9 lb

## 2019-05-01 DIAGNOSIS — R413 Other amnesia: Secondary | ICD-10-CM

## 2019-05-01 DIAGNOSIS — E782 Mixed hyperlipidemia: Secondary | ICD-10-CM | POA: Diagnosis not present

## 2019-05-01 DIAGNOSIS — Z952 Presence of prosthetic heart valve: Secondary | ICD-10-CM | POA: Diagnosis not present

## 2019-05-01 DIAGNOSIS — I2510 Atherosclerotic heart disease of native coronary artery without angina pectoris: Secondary | ICD-10-CM

## 2019-05-01 DIAGNOSIS — I059 Rheumatic mitral valve disease, unspecified: Secondary | ICD-10-CM

## 2019-05-01 LAB — HEPATIC FUNCTION PANEL
ALT: 20 IU/L (ref 0–44)
AST: 37 IU/L (ref 0–40)
Albumin: 3.6 g/dL (ref 3.6–4.6)
Alkaline Phosphatase: 89 IU/L (ref 39–117)
Bilirubin Total: 0.4 mg/dL (ref 0.0–1.2)
Bilirubin, Direct: 0.15 mg/dL (ref 0.00–0.40)
Total Protein: 6.7 g/dL (ref 6.0–8.5)

## 2019-05-01 LAB — BASIC METABOLIC PANEL
BUN/Creatinine Ratio: 23 (ref 10–24)
BUN: 29 mg/dL — ABNORMAL HIGH (ref 8–27)
CO2: 24 mmol/L (ref 20–29)
Calcium: 9.2 mg/dL (ref 8.6–10.2)
Chloride: 104 mmol/L (ref 96–106)
Creatinine, Ser: 1.27 mg/dL (ref 0.76–1.27)
GFR calc Af Amer: 61 mL/min/{1.73_m2} (ref >59)
GFR calc non Af Amer: 53 mL/min/{1.73_m2} — ABNORMAL LOW (ref >59)
Glucose: 85 mg/dL (ref 65–99)
Potassium: 4.4 mmol/L (ref 3.5–5.2)
Sodium: 141 mmol/L (ref 134–144)

## 2019-05-01 NOTE — Patient Instructions (Signed)
Medication Instructions:  Your physician recommends that you continue on your current medications as directed. Please refer to the Current Medication list given to you today.  *If you need a refill on your cardiac medications before your next appointment, please call your pharmacy*  Lab Work: Your physician recommends that you return for lab work  Today: BMET LFT's  If you have labs (blood work) drawn today and your tests are completely normal, you will receive your results only by: Marland Kitchen MyChart Message (if you have MyChart) OR . A paper copy in the mail If you have any lab test that is abnormal or we need to change your treatment, we will call you to review the results.  Testing/Procedures: None Ordered  Follow-Up: At [facility], you and your health needs are our priority.  As part of our continuing mission to provide you with exceptional heart care, we have created designated Provider Care Teams.  These Care Teams include your primary Cardiologist (physician) and Advanced Practice Providers (APPs -  Physician Assistants and Nurse Practitioners) who all work together to provide you with the care you need, when you need it.  Your next appointment:   6 month(s)  The format for your next appointment:   In Person  Provider:   Jyl Heinz, MD

## 2019-05-01 NOTE — Progress Notes (Signed)
Cardiology Office Note:    Date:  05/01/2019   ID:  Tanner Chambers, DOB [birth year], MRN [id number]  PCP:  Tanner Party, NP  Cardiologist:  Tanner Brothers, MD   Referring MD: Tanner Party, NP    ASSESSMENT:    1. Mitral valve disorder   2. Mixed hyperlipidemia   3. CAD in native artery   4. S/P MVR (mitral valve replacement)   5. Memory loss    PLAN:    In order of problems listed above:  1. Mitral valve replacement: Patient is on anticoagulation.  Echocardiogram and patient's clinical status is stable.  Unfortunately has advanced dementia but clinically stable at low level.  I discussed this with the patient and his wife at extensive length.  He is a retired Investment banker, operational. 2. Mixed dyslipidemia: Lipids managed by primary care physician and recently were checked and were fine.  Patient's wife is concerned about mild elevation in LFTs though they are within normal limits.  We will recheck them today and also do a Chem-7 he has he has mild renal insufficiency Patient will be seen in follow-up appointment in 6 months or earlier if the patient has any concerns    Medication Adjustments/Labs and Tests Ordered: Current medicines are reviewed at length with the patient today.  Concerns regarding medicines are outlined above.  No orders of the defined types were placed in this encounter.  No orders of the defined types were placed in this encounter.    No chief complaint on file.    History of Present Illness:    Tanner Chambers is a 83 y.o. male.  Patient has past medical history of mitral valve replacement with a mechanical valve, coronary artery disease and essential hypertension.  He has advanced dementia.  He denies any problems at this time.  He is an active gentleman.  No chest pain orthopnea or PND.  At the time of my evaluation, the patient is alert awake oriented and in no distress.  Past Medical History:  Diagnosis Date  . Arthritis   . Coronary artery disease   .  Hyperlipemia   . Hypertension   . Memory loss     Past Surgical History:  Procedure Laterality Date  . CORONARY ARTERY BYPASS GRAFT    . EYE SURGERY     both cataracts  . MITRAL VALVE SURGERY    . PROSTATE BIOPSY  2002  . SHOULDER ARTHROSCOPY     right  . SHOULDER ARTHROSCOPY W/ ROTATOR CUFF REPAIR  2002   left-dsc  . TONSILLECTOMY    . TRIGGER FINGER RELEASE Left 11/07/2012   Procedure: RELEASE TRIGGER FINGER/A-1 PULLEY LEFT RING;  Surgeon: Tanner Chambers., MD;  Location: [facility];  Service: Orthopedics;  Laterality: Left;    Current Medications: Current Meds  Medication Sig  . atorvastatin (LIPITOR) 80 MG tablet Take 80 mg by mouth daily.  . divalproex (DEPAKOTE ER) 500 MG 24 hr tablet TAKE 1 TABLET BY MOUTH  DAILY  . Melatonin 5 MG TABS Take by mouth. Take two at night  . Methylfol-Methylcob-Acetylcyst (CEREFOLIN NAC) 6-2-600 MG TABS Take 1 tablet by mouth daily.  . metoprolol tartrate (LOPRESSOR) 25 MG tablet Take 12.5 mg by mouth daily.   . Multiple Vitamin (MULTIVITAMIN) capsule Take 1 capsule by mouth daily.  Marland Kitchen NAMZARIC 28-10 MG CP24 TAKE 1 CAPSULE BY MOUTH  DAILY  . Resveratrol 100 MG CAPS Take by mouth.  . warfarin (COUMADIN) 6 MG tablet  TAKE 1 TABLET BY MOUTH  DAILY OR AS DIRECTED BY  [facility]     Allergies:   Ampicillin, Niacin, and Penicillins   Social History   Socioeconomic History  . Marital status: Married    Spouse name: Not on file  . Number of children: 3  . Years of education: MD  . Highest education level: Not on file  Occupational History  . Not on file  Tobacco Use  . Smoking status: Never Smoker  . Smokeless tobacco: Never Used  Substance and Sexual Activity  . Alcohol use: Yes    Alcohol/week: 0.0 standard drinks    Comment: occ  . Drug use: Yes    Types: Hydrocodone  . Sexual activity: Not on file  Other Topics Concern  . Not on file  Social History Narrative   Patient is married with 3 children.    Patient is right handed.   Patient is a Sport and exercise psychologist.   Patient drinks 2 cups daily.   Social Determinants of Health   Financial Resource Strain:   . Difficulty of Paying Living Expenses: Not on file  Food Insecurity:   . Worried About Charity fundraiser in the Last Year: Not on file  . Ran Out of Food in the Last Year: Not on file  Transportation Needs:   . Lack of Transportation (Medical): Not on file  . Lack of Transportation (Non-Medical): Not on file  Physical Activity:   . Days of Exercise per Week: Not on file  . Minutes of Exercise per Session: Not on file  Stress:   . Feeling of Stress : Not on file  Social Connections:   . Frequency of Communication with Friends and Family: Not on file  . Frequency of Social Gatherings with Friends and Family: Not on file  . Attends Religious Services: Not on file  . Active Member of Clubs or Organizations: Not on file  . Attends Archivist Meetings: Not on file  . Marital Status: Not on file     Family History: The patient's family history is not on file.  ROS:   Please see the history of present illness.    All other systems reviewed and are negative.  EKGs/Labs/Other Studies Reviewed:    The following studies were reviewed today: I discussed findings from the previous echocardiogram with the patient at length   Recent Labs: No results found for requested labs within last 8760 hours.  Recent Lipid Panel No results found for: CHOL, TRIG, HDL, CHOLHDL, VLDL, LDLCALC, LDLDIRECT  Physical Exam:    VS:  BP 128/70   Pulse 68   Wt 128 lb 14.4 oz (58.5 kg)   BMI 22.83 kg/m     Wt Readings from Last 3 Encounters:  05/01/19 128 lb 14.4 oz (58.5 kg)  02/22/19 130 lb (59 kg)  05/11/18 169 lb (76.7 kg)     GEN: Patient is in no acute distress HEENT: Normal NECK: No JVD; No carotid bruits LYMPHATICS: No lymphadenopathy CARDIAC: Hear sounds regular, 2/6 systolic murmur at the apex. RESPIRATORY:  Clear to  auscultation without rales, wheezing or rhonchi  ABDOMEN: Soft, non-tender, non-distended MUSCULOSKELETAL:  No edema; No deformity  SKIN: Warm and dry NEUROLOGIC:  Alert and oriented x 3 PSYCHIATRIC:  Normal affect   Signed, Jenean Lindau, MD  05/01/2019 10:09 AM    [place]

## 2019-05-07 ENCOUNTER — Encounter: Payer: Self-pay | Admitting: *Deleted

## 2019-05-11 ENCOUNTER — Other Ambulatory Visit: Payer: Self-pay | Admitting: Cardiology

## 2019-05-11 ENCOUNTER — Other Ambulatory Visit: Payer: Self-pay | Admitting: Neurology

## 2019-05-11 DIAGNOSIS — F0280 Dementia in other diseases classified elsewhere without behavioral disturbance: Secondary | ICD-10-CM

## 2019-05-15 ENCOUNTER — Ambulatory Visit (INDEPENDENT_AMBULATORY_CARE_PROVIDER_SITE_OTHER): Payer: Medicare Other | Admitting: Pharmacist Clinician (PhC)/ Clinical Pharmacy Specialist

## 2019-05-15 ENCOUNTER — Other Ambulatory Visit: Payer: Self-pay

## 2019-05-15 DIAGNOSIS — Z954 Presence of other heart-valve replacement: Secondary | ICD-10-CM | POA: Diagnosis not present

## 2019-05-15 DIAGNOSIS — Z7901 Long term (current) use of anticoagulants: Secondary | ICD-10-CM | POA: Diagnosis not present

## 2019-05-15 DIAGNOSIS — Z952 Presence of prosthetic heart valve: Secondary | ICD-10-CM

## 2019-05-15 DIAGNOSIS — I059 Rheumatic mitral valve disease, unspecified: Secondary | ICD-10-CM

## 2019-05-15 LAB — POCT INR: INR: 3.3 — AB (ref 2.0–3.0)

## 2019-05-15 MED ORDER — WARFARIN SODIUM 6 MG PO TABS: ORAL_TABLET | ORAL | 0 refills | Status: AC

## 2019-06-12 ENCOUNTER — Ambulatory Visit (INDEPENDENT_AMBULATORY_CARE_PROVIDER_SITE_OTHER): Payer: Medicare Other | Admitting: *Deleted

## 2019-06-12 ENCOUNTER — Other Ambulatory Visit: Payer: Self-pay

## 2019-06-12 DIAGNOSIS — I059 Rheumatic mitral valve disease, unspecified: Secondary | ICD-10-CM | POA: Diagnosis not present

## 2019-06-12 DIAGNOSIS — Z954 Presence of other heart-valve replacement: Secondary | ICD-10-CM | POA: Diagnosis not present

## 2019-06-12 DIAGNOSIS — Z7901 Long term (current) use of anticoagulants: Secondary | ICD-10-CM | POA: Diagnosis not present

## 2019-06-12 DIAGNOSIS — Z952 Presence of prosthetic heart valve: Secondary | ICD-10-CM | POA: Diagnosis not present

## 2019-06-12 LAB — POCT INR: INR: 3.1 — AB (ref 2.0–3.0)

## 2019-06-12 NOTE — Patient Instructions (Signed)
Continue  warfarin 1 tablet daily except 1/2 tablet on Tuesdays  and Saturdays.  Recheck in 4 weeks. Call us with any concerns or medication changes # [phone] 

## 2019-07-10 ENCOUNTER — Ambulatory Visit (INDEPENDENT_AMBULATORY_CARE_PROVIDER_SITE_OTHER): Payer: Medicare Other | Admitting: Pharmacist Clinician (PhC)/ Clinical Pharmacy Specialist

## 2019-07-10 ENCOUNTER — Other Ambulatory Visit: Payer: Self-pay

## 2019-07-10 DIAGNOSIS — I059 Rheumatic mitral valve disease, unspecified: Secondary | ICD-10-CM

## 2019-07-10 DIAGNOSIS — Z952 Presence of prosthetic heart valve: Secondary | ICD-10-CM

## 2019-07-10 DIAGNOSIS — Z7901 Long term (current) use of anticoagulants: Secondary | ICD-10-CM | POA: Diagnosis not present

## 2019-07-10 DIAGNOSIS — Z954 Presence of other heart-valve replacement: Secondary | ICD-10-CM

## 2019-07-10 LAB — POCT INR: INR: 2.6 (ref 2.0–3.0)

## 2019-07-10 NOTE — Patient Instructions (Signed)
Continue  warfarin 1 tablet daily except 1/2 tablet on Tuesdays  and Saturdays.  Recheck in 4 weeks. Call us with any concerns or medication changes # [phone]

## 2019-07-21 ENCOUNTER — Other Ambulatory Visit (HOSPITAL_COMMUNITY): Payer: Self-pay | Admitting: Neurology

## 2019-07-21 DIAGNOSIS — G3184 Mild cognitive impairment, so stated: Secondary | ICD-10-CM

## 2019-07-21 DIAGNOSIS — R413 Other amnesia: Secondary | ICD-10-CM

## 2019-07-21 MED ORDER — CEREFOLIN NAC 6-2-600 MG PO TABS: 1.0000 | ORAL_TABLET | Freq: Every day | ORAL | 3 refills | Status: AC

## 2019-08-07 ENCOUNTER — Ambulatory Visit (INDEPENDENT_AMBULATORY_CARE_PROVIDER_SITE_OTHER): Payer: Medicare Other | Admitting: Pharmacist Clinician (PhC)/ Clinical Pharmacy Specialist

## 2019-08-07 ENCOUNTER — Other Ambulatory Visit: Payer: Self-pay

## 2019-08-07 DIAGNOSIS — Z952 Presence of prosthetic heart valve: Secondary | ICD-10-CM | POA: Diagnosis not present

## 2019-08-07 DIAGNOSIS — I059 Rheumatic mitral valve disease, unspecified: Secondary | ICD-10-CM | POA: Diagnosis not present

## 2019-08-07 DIAGNOSIS — Z954 Presence of other heart-valve replacement: Secondary | ICD-10-CM

## 2019-08-07 DIAGNOSIS — Z7901 Long term (current) use of anticoagulants: Secondary | ICD-10-CM

## 2019-08-07 LAB — POCT INR: INR: 2.7 (ref 2.0–3.0)

## 2019-08-07 NOTE — Patient Instructions (Signed)
Continue  warfarin 1 tablet daily except 1/2 tablet on Tuesdays  and Saturdays.  Recheck in 6 weeks. Call us with any concerns or medication changes # [phone]

## 2019-08-23 ENCOUNTER — Encounter: Payer: Self-pay | Admitting: Neurology

## 2019-08-23 ENCOUNTER — Other Ambulatory Visit: Payer: Self-pay

## 2019-08-23 ENCOUNTER — Ambulatory Visit (INDEPENDENT_AMBULATORY_CARE_PROVIDER_SITE_OTHER): Payer: Medicare Other | Admitting: Neurology

## 2019-08-23 VITALS — BP 115/69 | HR 72 | Temp 96.9°F | Wt 128.0 lb

## 2019-08-23 DIAGNOSIS — G301 Alzheimer's disease with late onset: Secondary | ICD-10-CM | POA: Diagnosis not present

## 2019-08-23 DIAGNOSIS — F0281 Dementia in other diseases classified elsewhere with behavioral disturbance: Secondary | ICD-10-CM | POA: Diagnosis not present

## 2019-08-23 DIAGNOSIS — F02818 Dementia in other diseases classified elsewhere, unspecified severity, with other behavioral disturbance: Secondary | ICD-10-CM

## 2019-08-23 MED ORDER — QUETIAPINE FUMARATE 25 MG PO TABS: 12.5000 mg | ORAL_TABLET | Freq: Every day | ORAL | 1 refills | Status: AC

## 2019-08-23 NOTE — Patient Instructions (Signed)
I had a long discussion with the patient and his wife regarding his behavioral changes and dementia.Unfortunately he has severe dementia and does not qualify at present for any ongoing trials that I have.  .I recommend he continue Depakote ER inthe current dose of 500 mg daily and due to his fluctuations in INR I am reluctant to increase the dose further. He will continue on the current dose of Namzaric and Cerefolin and patient's wife was educated about practical tips in managing agitation and behavioral modification.     Seroquel starting at 12.5 mg at bedtime to increase to 25 mg if necessary to help with his sundowning and nocturnal agitation and insomnia. He will return for follow-up in 6 months or call earlier if necessary.

## 2019-08-23 NOTE — Progress Notes (Signed)
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 The family has noticed progressive memory loss and mild cognitive difficulties over the last 1 year stroke. He has been increasingly forgetful in locking the house or turning of the lights when he goes out. He also has trouble remembering names of people but he can remember this after a while. He does also have mild anxiety which seems to have increased recently but this is not any medications for that.He has bilateral hearing loss since last 5 years following complications of gentamicin usage for treatment for mitral valve endocarditis with prolonged antibiotics 5 years ago. He underwent mitral valve replacement at that time and has been on warfarin since then with a relatively stable INR. He denies any history of strokes, TIAs, speech problems, gait or balance problems. There have been no significant behavioral issues, hallucinations, delusions, significant headache noted. His gait and balance seem fine and there is no fall or safety risks identified. There is no prior history of seizures, significant head injury or loss of consciousness. Is no family history of dementia. He has not had any evaluation for treatable causes of memory loss or brain imaging done recently.he remains independent with activities of daily living and is able to take care of his own needs.  Update 1/8/2016PS : He returns for follow-up today after his last visit 3 months ago accompanied by his son. He has noticed improvement in his memory as well as concentration and cognitive difficulties. He has been taking Cerefolin and reservetrol every day . He also participates in cognitively challenging activities. He recently traveled to [place] for a month and  had slight difficulties in coping. He forgot his INR machine. He remains independent with activities of daily living and does not need help with any activities. He has no neuro neurological complaints. He underwent lab work for treatable causes of cognitive impairment which was negative. EEG showed no seizure activity. MRI scan of the brain personally reviewed shows mild changes of chronic microvascular ischemia and microhemorrhages  Update 11/22/2014 PS: He returns for follow-up after last visit 6 months ago. Is accompanied by his wife and son who contributed to the history. They have noticed cognitive decline since the last visit With the patient having good days and bad days and being confused and disoriented particularly when he has to learn some new activity in task. At times he is had trouble recognizing familiar faces on one occasion he had trouble coming up with his daughter's name. He was seen by his primary physician recently who gave him a sample pack of Namenda to try but he has not started that yet. He denies any fall, injury, focal symptoms suggestive of a stroke in the form of speech difficulties extremity weakness numbness gait or balance problems. He remains on warfarin which is tolerating well with INR being fairly stable. Update 12/20/2014 PS: He returns for follow-up after last visit 1 month ago. He initially reported some side effects on Namenda in the form of cough and feeling strange but that seems to have settled down. He was having some bad dreams initially and that also seems to be improving. He feels overall he is quite well during the day with better memory and concentration. However at nights he is restless and  often wakes up early in the morning. He does Nap for 10-15 minutes during the daytime but does manage to sleep 5-6 hours every night. He states his INR has been quite stable and he is on a fairly regular dose of warfarin. On Mini-Mental status exam today scored 27/30 which is  much improved from 22/30 at last visit.  11/9/2016PS : He returns for follow-up to last visit 3 months ago. Is accompanied by his wife. Patient continues to have noticed improvement in his memory and cognition after taking Namenda XR 28 mg daily. He keeps himself busy by solving crossword puzzles, doing sudoku and computer brain games. He had recent eye surgery for blocked lacrimal ducts which went well. He does have some bruises from the surgery on his periorbital region. The patient has several questions about his driving as well as ability to travel alone to [place] his [place] in [place]. On Mini-Mental status exam today scored 25/30 which is not significantly changed from 27/30 at last visit. He was able to name 14 four legged animals and clock drawing score was 3/4.He is sleeping better. And has no complaints UPDATE 05/10/2017CM Patient returns for six-month follow-up with his wife for memory loss. He and his wife both think his memory is about the same. He is taking Namenda XR 28 mg daily. He continues to do games on the computer. He continues to drive short distances in familiar places without getting loss. He has no new complaints.  UPDATE 11/10/17CM Tanner Chambers, 84 year old male returns for follow-up with his wife. He has a history of memory loss. The patient thinks his memory is about the same, the wife thinks he is more forgetful. His score is less than it was at his last visit. He is currently on Namenda XR 28  mg daily. He has never been on Aricept. He continues to drive short distances and familiar places without getting lost however the wife tells me today  sometimes she is afraid to ride with him. He has also had a sporadic hallucination at night several times since last seen. It sounds more like shadows in the dark to me on description. No other medical issues since last seen. He has significant hearing loss. He returns for reevaluation.  Update 06/30/2016 : He returns for follow-up after last  visit 3 months ago. He has tolerated Aricept 5 mg daily without any GI or CNS side effects. There has been some improvement in his memory and cognition but patient's wife states that particularly patient is unable to recognize only her. He has no trouble recognizing other family members and friends. Patient has still been driving but wife is concerned about his safety about doing so. Patient remains on Coumadin with INR being fairly stable. He has I  had no stroke or TIA symptoms. There have been no unsafe behavior or trouble with   agitation. There have been not many delusions or hallucinations. He has no trouble with walking and balance and there have been no falls Update 12/15/2016 : He returns for follow-up after last visit 6 months ago. Is accompanied by his wife. He has been able to tolerate Namzaric well without any obvious side effects and in fact both of them feel there may be some cognitive stabilization. He is not able to recognize his wife mostly does get occasionally confused. He still needs close supervision. He is still driving but has agreed to drive on the with his wife next to him. He has had no episodes of  getting lost any agitation or violent behavior. There have been no hallucinations or delusions. He has unfortunately lost his hearing aid 2 days ago and has having trouble hearing. Despite this he did better on the Mini-Mental testing in office today compared to last visit. Patient in pounds with a few weeks ago he had minor fall when he hit the back of his head but did not lose consciousness. There was no bleeding or swelling noted in his scalp. On exam today there is no tenderness or bruises noted by me. He denies any headache, gait or balance difficulties or neurological change after that fall. He remains on warfarin which is tolerating well and his INR has been stable. Update 06/27/2017 ; he returns for follow-up after last of that 6 months ago. Is accompanied by his wife. She states that his  remaining more or less the same cognitively. Does have periods of intermittent confusion and disorientation. At times he still does not recognize his wife. He can be redirected easily. There has been no significant agitation or violent behavior. This mentions a delusions. There are no unsafe behaviors. He does not wander out. He does socialize still quite a bit but his wife is always with her. Is tolerating warfarin well without bleeding or bruising. He is scheduled to have a follow-up echocardiogram by his cardiologist next week. He has decided to participate in the trachea visit dementia trial and had study specific screening MRI done last week which have personally reviewed shows no significant change from prior MRI 3 years ago. On Mini-Mental status testing today scored 21/30 which is slightly low from 23/30 from last visit. He remains on nonselective which is tolerating well without any side effects. He also takes Resevetarol and cerfolin nac:  Update 12/27/2017 : The patient returns for scheduled 6 monthly follow-up visit. His wife informs me that he had an episode of agitated delirium a few weeks ago. Patient had taken a few doses of clindamycin for a presumed dental surgery which eventually had to be postponed since his INR was quite elevated. The patient forcibly took the keys to the car and drove and apparently got disoriented and lost but eventually was able to make his way back home. He was also quite agitated and restless for couple of days. He called up his brother-in-law and express some desire to end it all. The patient however has improved since then. He seems to be his usual self as per his wife for the last few weeks. On the depression screening questionnaire today in the office he scored only 2 periods on the Mini-Mental status he showed a decline in his score of 16/30 compared to previous scored 21/30 but the patient and wife both felt that he did not understand the questions due to his hearing  difficulty. The patient remains on them so they consider full and pain. He's had no prior history of depression on medications for that. The patient was screened for   for the Trailblazer dementia study but failed to qualify due to having multiple cerebral microhemorrhages on MRI.  Update 09/05/18 : This is a virtual video follow-up visit following last office visit in August 2019.  He continues to have significant cognitive issues and behavioral disturbance.  He has at times certain fixed delusions.  Recently he thinks he is not living in his house and has been packing his clothes and keeping it in the car in the garage to go to his house.  Despite being redirected by his  wife this pattern of behavior persist.  His wife had called me a month ago and we had started him on Depakote ER 500 mg daily which seems to have reduced his behavioral agitation and calmed him down however the wife feels that his INR has been fluctuating in the last few weeks which may be related to Depakote.  He has not had any hallucinations but he still at times does not recognize his wife but now he can tolerate her at home.  He continues to struggle with speech and word finding and is often unable to complete sentences and has paraphasic errors.  He has good days and bad days.  He remains on Namzaric 28/10 daily and is tolerating it well.  He is also on Cerefolin.  He had a follow-up visit with his cardiologist few months ago and no medication changes were made.  The patient's wife is requesting an adult daycare referral due to caregiver burnout.  Patient had called me in October saying that he had been having a headache for 3 months.  When I spoke to his wife she stated that he had never mentioned it to her.  We obtained a CT scan of the head at [facility] on 03/03/2018 which showed no acute findings and stable cortical atrophy unchanged from February 2019.  Update 02/22/2019 : He returns for f/u after last visit 6 months ago. He  is accompanied by his wife.  Patient continues to have significant cognitive difficulties and some occasional behavioral and interaction issues.  Wife states that he is more interested in any social interaction or activities anymore.  His son who is visiting there is concerned that he may be depressed.  Patient has been eating well.  He may have lost some weight.  His INR has been fairly stable.  He at times gets agitated and screams but there has not been any violent behavior.  There have been no delusions or hallucinations.  He often forgets his own home recognizing family members.  He has had a couple of minor falls but no major injuries.  On Mini-Mental status exam today he scored 11/30 which is little decline from 16/30 from a year ago.  And depression scale he scored only 2 which is not suggestive of depression.  He remains on Depakote ER 500 mg which she seems to be tolerating well without side effects.  heis also on Namzaric and Cerefolin NAC.  He unfortunately does not qualify for the Trailblazer dementia studies because of presence of greater than 4 microhemorrhages on his last MRI Update 08/23/2019 : He returns for follow-up after last visit 6 months ago.  He is accompanied by his wife who feels that patient continues to have significant cognitive and occasional mostly nocturnal behavior and agitation issues and sundowning.  Is mostly calm during the day has stopped doing most activities and just sits at home.  Due to Covid restrictions he is not going to adult daycare or socializing much with friends.  He has trouble recognizing family members also on most days but he does smile but is unable to come up with the names.  He takes melatonin at around 730 every evening and falls asleep within an hour but will wake up at 10:00 in will be up most of the night slightly agitated but can be redirected.  He has not tried taking Seroquel is willing to try it.  Remains on Depakote ER 500 mg during the day and due to  concerns about possible  interaction with his Coumadin and have not increased the dose in the past.  He has not been having significant delusions or hallucinations but at times he gets confused and starts packing his clothes insisting on going home.Marland Kitchen  He remains on Namzaric, Cerefolin and Depakote.  His son who is a physician was visiting and recommended a new medication Belsomra ( suvorexant) for insomnia but I do not have any experience with this medication and patient seems to be having more sundowning and agitation rather than isolated insomnia hence I feel Seroquel may be better REVIEW OF SYSTEMS: Full 14 system review of systems performed and notable only for those listed, all others are neg:  Memory loss, hearing loss, confusion ,  agitation, depression, hallucinations, insomnia, snoring and all other systems negative ALLERGIES: Allergies  Allergen Reactions  . Ampicillin Other (See Comments)    Ask Patient  . Niacin Other (See Comments)    Gout  . Penicillins     HOME MEDICATIONS: Outpatient Medications Prior to Visit  Medication Sig Dispense Refill  . atorvastatin (LIPITOR) 80 MG tablet Take 80 mg by mouth daily.    . divalproex (DEPAKOTE ER) 500 MG 24 hr tablet TAKE 1 TABLET BY MOUTH  DAILY 90 tablet 3  . Melatonin 5 MG TABS Take by mouth. Take two at night    . Methylfol-Methylcob-Acetylcyst (CEREFOLIN NAC) 6-2-600 MG TABS Take 1 tablet by mouth daily. 90 tablet 3  . metoprolol tartrate (LOPRESSOR) 25 MG tablet Take 12.5 mg by mouth daily.     . Multiple Vitamin (MULTIVITAMIN) capsule Take 1 capsule by mouth daily.    Marland Kitchen NAMZARIC 28-10 MG CP24 TAKE 1 CAPSULE BY MOUTH  DAILY 90 capsule 3  . ondansetron (ZOFRAN) 4 MG tablet Take 4 mg by mouth every 8 (eight) hours as needed for nausea or vomiting.    . Pantoprazole Sodium (PROTONIX PO) Take by mouth.    . Resveratrol 100 MG CAPS Take by mouth.    . warfarin (COUMADIN) 6 MG tablet Take 1 to 2 Tablets Daily As Directed By The [facility] 45 tablet 0   No facility-administered medications prior to visit.    PAST MEDICAL HISTORY: Past Medical History:  Diagnosis Date  . Arthritis   . Coronary artery disease   . Hyperlipemia   . Hypertension   . Memory loss     PAST SURGICAL HISTORY: Past Surgical History:  Procedure Laterality Date  . CORONARY ARTERY BYPASS GRAFT    . EYE SURGERY     both cataracts  . MITRAL VALVE SURGERY    . PROSTATE BIOPSY  2002  . SHOULDER ARTHROSCOPY     right  . SHOULDER ARTHROSCOPY W/ ROTATOR CUFF REPAIR  2002   left-dsc  . TONSILLECTOMY    . TRIGGER FINGER RELEASE Left 11/07/2012   Procedure: RELEASE TRIGGER FINGER/A-1 PULLEY LEFT RING;  Surgeon: Wyn Forster., MD;  Location: [facility];  Service: Orthopedics;  Laterality: Left;    FAMILY HISTORY: No family history on file.  SOCIAL HISTORY: Social History   Socioeconomic History  . Marital status: Married    Spouse name: Not on file  . Number of children: 3  . Years of education: MD  . Highest education level: Not on file  Occupational History  . Not on file  Tobacco Use  . Smoking status: Never Smoker  . Smokeless tobacco: Never Used  Substance and Sexual Activity  . Alcohol use: Yes    Alcohol/week:  0.0 standard drinks    Comment: occ  . Drug use: Yes    Types: Hydrocodone  . Sexual activity: Not on file  Other Topics Concern  . Not on file  Social History Narrative   Patient is married with 3 children.   Patient is right handed.   Patient is a Proofreader.   Patient drinks 2 cups daily.   Social Determinants of Health   Financial Resource Strain:   . Difficulty of Paying Living Expenses:   Food Insecurity:   . Worried About Programme researcher, broadcasting/film/video in the Last Year:   . Barista in the Last Year:   Transportation Needs:   . Freight forwarder (Medical):   Marland Kitchen Lack of Transportation (Non-Medical):   Physical Activity:   . Days of Exercise per Week:   . Minutes of  Exercise per Session:   Stress:   . Feeling of Stress :   Social Connections:   . Frequency of Communication with Friends and Family:   . Frequency of Social Gatherings with Friends and Family:   . Attends Religious Services:   . Active Member of Clubs or Organizations:   . Attends Banker Meetings:   Marland Kitchen Marital Status:   Intimate Partner Violence:   . Fear of Current or Ex-Partner:   . Emotionally Abused:   Marland Kitchen Physically Abused:   . Sexually Abused:      PHYSICAL EXAM  Vitals:   08/23/19 1048  BP: 115/69  Pulse: 72  Temp: (!) 96.9 F (36.1 C)  Weight: 58.1 kg   Body mass index is 22.67 kg/m. General: well developed, well nourished, elderly  Asian [place] male seated, in no evident distress Head: head normocephalic and atraumatic.  Neck: supple with no carotid  bruits Cardiovascular: regular rate and rhythm, no murmurs Musculoskeletal: no deformity Skin: no rash/petichiae.  Vascular: Normal pulses all extremities  Neurological examination   Mentation:Awake and  alert. . On Mini-Mental status exam not done today but he has obvious   deficits in orientation, recall, repetition, writing and drawing. He has nonfluent hesitant speech and is barely able to complete sentences.  He has significant word finding difficulties.  He is unable to recall any recent and even past events.  He does not recognize me as his physician MMSE - Mini Mental State Exam 02/22/2019 12/27/2017 06/27/2017  Orientation to time 0 3 2  Orientation to Place 2 1 5   Registration 3 3 3   Attention/ Calculation 0 2 2  Recall 0 1 3  Language- name 2 objects 2 2 1   Language- repeat 0 0 0  Language- follow 3 step command 3 3 3   Language- read & follow direction 1 1 1   Write a sentence 0 0 0  Copy design 0 0 1  Total score 11 16 21      Cranial Nerves:  Pupils equal, briskly reactive to light. Extraocular movements full without nystagmus. Visual fields full to confrontation. Hearing reduced  bilaterallyt. Facial sensation intact. Face, tongue, palate moves normally and symmetrically.  Motor: Normal bulk and tone. Normal strength in all tested extremity muscles. Sensory.: intact to touch , pinprick , position and vibratory sensation.  Coordination: Rapid alternating movements normal in all extremities. Finger-to-nose and heel-to-shin performed accurately bilaterally. Gait and Station: Arises from chair without difficulty. Stance is normal. Gait demonstrates normal stride length and balance . Unable to heel, toe and tandem walk without difficulty. No assistive device Reflexes: 1+ and symmetric.  Toes downgoing.    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  He is having sundowning and nocturnal agitation and insomnia.Marland Kitchen. PLAN:   I had a long discussion with the patient and his wife regarding his behavioral changes and dementia.Unfortunately he has severe dementia and does not qualify at present for any ongoing trials that I have.  .I recommend he continue Depakote ER inthe current dose of 500 mg daily and due to his fluctuations in INR I am reluctant to increase the dose further. He will continue on the current dose of Namzaric and Cerefolin and patient's wife was educated about practical tips in managing agitation and behavioral modification.     Seroquel starting at 12.5 mg at bedtime to increase to 25 mg if necessary to help with his sundowning and nocturnal agitation and insomnia. He will return for follow-up in 6 months or call earlier if necessary..Greater than 50% time during this 30 minute visit was spent on counseling and coordination of care about his dementia and discussion of available treatment options,availibility of clinical trials and his driving ability and  answering questions   Delia HeadyPramod Roland Prine, MD [facility] [address], [address] [place], [place] [address] [phone]

## 2019-09-11 DIAGNOSIS — M7060 Trochanteric bursitis, unspecified hip: Secondary | ICD-10-CM | POA: Diagnosis not present

## 2019-09-18 ENCOUNTER — Ambulatory Visit (INDEPENDENT_AMBULATORY_CARE_PROVIDER_SITE_OTHER): Payer: Medicare Other | Admitting: Pharmacist

## 2019-09-18 ENCOUNTER — Other Ambulatory Visit: Payer: Self-pay

## 2019-09-18 DIAGNOSIS — Z954 Presence of other heart-valve replacement: Secondary | ICD-10-CM | POA: Diagnosis not present

## 2019-09-18 DIAGNOSIS — Z7901 Long term (current) use of anticoagulants: Secondary | ICD-10-CM | POA: Diagnosis not present

## 2019-09-18 DIAGNOSIS — Z952 Presence of prosthetic heart valve: Secondary | ICD-10-CM

## 2019-09-18 DIAGNOSIS — I059 Rheumatic mitral valve disease, unspecified: Secondary | ICD-10-CM | POA: Diagnosis not present

## 2019-09-18 LAB — POCT INR: INR: 2.4 (ref 2.0–3.0)

## 2019-09-18 NOTE — Patient Instructions (Signed)
Description   Take 1 tablet today, then continue warfarin 1 tablet daily except 1/2 tablet on Tuesdays and Saturdays.  Recheck in 5 weeks. Call us with any concerns or medication changes # [phone]

## 2019-09-28 DIAGNOSIS — E785 Hyperlipidemia, unspecified: Secondary | ICD-10-CM | POA: Diagnosis not present

## 2019-09-28 DIAGNOSIS — I2510 Atherosclerotic heart disease of native coronary artery without angina pectoris: Secondary | ICD-10-CM | POA: Diagnosis not present

## 2019-09-28 DIAGNOSIS — I10 Essential (primary) hypertension: Secondary | ICD-10-CM | POA: Diagnosis not present

## 2019-09-28 DIAGNOSIS — D696 Thrombocytopenia, unspecified: Secondary | ICD-10-CM | POA: Diagnosis not present

## 2019-10-24 DIAGNOSIS — R4182 Altered mental status, unspecified: Secondary | ICD-10-CM | POA: Diagnosis not present

## 2019-10-24 DIAGNOSIS — M542 Cervicalgia: Secondary | ICD-10-CM | POA: Diagnosis not present

## 2019-10-24 DIAGNOSIS — W19XXXA Unspecified fall, initial encounter: Secondary | ICD-10-CM | POA: Diagnosis not present

## 2019-10-24 DIAGNOSIS — I5042 Chronic combined systolic (congestive) and diastolic (congestive) heart failure: Secondary | ICD-10-CM | POA: Diagnosis not present

## 2019-10-24 DIAGNOSIS — G9341 Metabolic encephalopathy: Secondary | ICD-10-CM | POA: Diagnosis not present

## 2019-10-24 DIAGNOSIS — R52 Pain, unspecified: Secondary | ICD-10-CM | POA: Diagnosis not present

## 2019-10-24 DIAGNOSIS — R0989 Other specified symptoms and signs involving the circulatory and respiratory systems: Secondary | ICD-10-CM | POA: Diagnosis not present

## 2019-10-25 DIAGNOSIS — G9341 Metabolic encephalopathy: Secondary | ICD-10-CM | POA: Diagnosis not present

## 2019-10-26 DIAGNOSIS — G9341 Metabolic encephalopathy: Secondary | ICD-10-CM | POA: Diagnosis not present

## 2019-11-16 DIAGNOSIS — S065X0D Traumatic subdural hemorrhage without loss of consciousness, subsequent encounter: Secondary | ICD-10-CM | POA: Diagnosis not present

## 2019-11-16 DIAGNOSIS — I4891 Unspecified atrial fibrillation: Secondary | ICD-10-CM | POA: Diagnosis not present

## 2019-11-16 DIAGNOSIS — F0281 Dementia in other diseases classified elsewhere with behavioral disturbance: Secondary | ICD-10-CM | POA: Diagnosis not present

## 2019-11-16 DIAGNOSIS — Z954 Presence of other heart-valve replacement: Secondary | ICD-10-CM | POA: Diagnosis not present

## 2019-11-16 DIAGNOSIS — R2689 Other abnormalities of gait and mobility: Secondary | ICD-10-CM | POA: Diagnosis not present

## 2019-11-16 DIAGNOSIS — M6281 Muscle weakness (generalized): Secondary | ICD-10-CM | POA: Diagnosis not present

## 2019-11-16 DIAGNOSIS — S93401D Sprain of unspecified ligament of right ankle, subsequent encounter: Secondary | ICD-10-CM | POA: Diagnosis not present

## 2019-11-16 DIAGNOSIS — R296 Repeated falls: Secondary | ICD-10-CM | POA: Diagnosis not present

## 2019-11-16 DIAGNOSIS — R41841 Cognitive communication deficit: Secondary | ICD-10-CM | POA: Diagnosis not present

## 2019-11-16 DIAGNOSIS — G309 Alzheimer's disease, unspecified: Secondary | ICD-10-CM | POA: Diagnosis not present

## 2019-11-16 DIAGNOSIS — R2681 Unsteadiness on feet: Secondary | ICD-10-CM | POA: Diagnosis not present

## 2019-11-19 DIAGNOSIS — R2681 Unsteadiness on feet: Secondary | ICD-10-CM | POA: Diagnosis not present

## 2019-11-19 DIAGNOSIS — R41841 Cognitive communication deficit: Secondary | ICD-10-CM | POA: Diagnosis not present

## 2019-11-19 DIAGNOSIS — S065X0D Traumatic subdural hemorrhage without loss of consciousness, subsequent encounter: Secondary | ICD-10-CM | POA: Diagnosis not present

## 2019-11-19 DIAGNOSIS — R296 Repeated falls: Secondary | ICD-10-CM | POA: Diagnosis not present

## 2019-11-19 DIAGNOSIS — R2689 Other abnormalities of gait and mobility: Secondary | ICD-10-CM | POA: Diagnosis not present

## 2019-11-19 DIAGNOSIS — M6281 Muscle weakness (generalized): Secondary | ICD-10-CM | POA: Diagnosis not present

## 2019-11-20 DIAGNOSIS — R2689 Other abnormalities of gait and mobility: Secondary | ICD-10-CM | POA: Diagnosis not present

## 2019-11-20 DIAGNOSIS — I4891 Unspecified atrial fibrillation: Secondary | ICD-10-CM | POA: Diagnosis not present

## 2019-11-20 DIAGNOSIS — R41841 Cognitive communication deficit: Secondary | ICD-10-CM | POA: Diagnosis not present

## 2019-11-20 DIAGNOSIS — M6281 Muscle weakness (generalized): Secondary | ICD-10-CM | POA: Diagnosis not present

## 2019-11-20 DIAGNOSIS — S065X0D Traumatic subdural hemorrhage without loss of consciousness, subsequent encounter: Secondary | ICD-10-CM | POA: Diagnosis not present

## 2019-11-20 DIAGNOSIS — R296 Repeated falls: Secondary | ICD-10-CM | POA: Diagnosis not present

## 2019-11-20 DIAGNOSIS — R2681 Unsteadiness on feet: Secondary | ICD-10-CM | POA: Diagnosis not present

## 2019-11-21 DIAGNOSIS — R2689 Other abnormalities of gait and mobility: Secondary | ICD-10-CM | POA: Diagnosis not present

## 2019-11-21 DIAGNOSIS — S065X0D Traumatic subdural hemorrhage without loss of consciousness, subsequent encounter: Secondary | ICD-10-CM | POA: Diagnosis not present

## 2019-11-21 DIAGNOSIS — R2681 Unsteadiness on feet: Secondary | ICD-10-CM | POA: Diagnosis not present

## 2019-11-21 DIAGNOSIS — R41841 Cognitive communication deficit: Secondary | ICD-10-CM | POA: Diagnosis not present

## 2019-11-21 DIAGNOSIS — R296 Repeated falls: Secondary | ICD-10-CM | POA: Diagnosis not present

## 2019-11-21 DIAGNOSIS — M6281 Muscle weakness (generalized): Secondary | ICD-10-CM | POA: Diagnosis not present

## 2019-11-22 DIAGNOSIS — R2689 Other abnormalities of gait and mobility: Secondary | ICD-10-CM | POA: Diagnosis not present

## 2019-11-22 DIAGNOSIS — R296 Repeated falls: Secondary | ICD-10-CM | POA: Diagnosis not present

## 2019-11-22 DIAGNOSIS — R2681 Unsteadiness on feet: Secondary | ICD-10-CM | POA: Diagnosis not present

## 2019-11-22 DIAGNOSIS — R41841 Cognitive communication deficit: Secondary | ICD-10-CM | POA: Diagnosis not present

## 2019-11-22 DIAGNOSIS — S065X0D Traumatic subdural hemorrhage without loss of consciousness, subsequent encounter: Secondary | ICD-10-CM | POA: Diagnosis not present

## 2019-11-22 DIAGNOSIS — M6281 Muscle weakness (generalized): Secondary | ICD-10-CM | POA: Diagnosis not present

## 2019-11-25 DIAGNOSIS — I4891 Unspecified atrial fibrillation: Secondary | ICD-10-CM | POA: Diagnosis not present

## 2019-11-25 DIAGNOSIS — D696 Thrombocytopenia, unspecified: Secondary | ICD-10-CM | POA: Diagnosis not present

## 2019-11-26 DIAGNOSIS — M6281 Muscle weakness (generalized): Secondary | ICD-10-CM | POA: Diagnosis not present

## 2019-11-26 DIAGNOSIS — R2681 Unsteadiness on feet: Secondary | ICD-10-CM | POA: Diagnosis not present

## 2019-11-26 DIAGNOSIS — S065X0D Traumatic subdural hemorrhage without loss of consciousness, subsequent encounter: Secondary | ICD-10-CM | POA: Diagnosis not present

## 2019-11-26 DIAGNOSIS — R41841 Cognitive communication deficit: Secondary | ICD-10-CM | POA: Diagnosis not present

## 2019-11-26 DIAGNOSIS — R296 Repeated falls: Secondary | ICD-10-CM | POA: Diagnosis not present

## 2019-11-26 DIAGNOSIS — R2689 Other abnormalities of gait and mobility: Secondary | ICD-10-CM | POA: Diagnosis not present

## 2019-11-27 DIAGNOSIS — R2681 Unsteadiness on feet: Secondary | ICD-10-CM | POA: Diagnosis not present

## 2019-11-27 DIAGNOSIS — S065X0D Traumatic subdural hemorrhage without loss of consciousness, subsequent encounter: Secondary | ICD-10-CM | POA: Diagnosis not present

## 2019-11-27 DIAGNOSIS — R296 Repeated falls: Secondary | ICD-10-CM | POA: Diagnosis not present

## 2019-11-27 DIAGNOSIS — R41841 Cognitive communication deficit: Secondary | ICD-10-CM | POA: Diagnosis not present

## 2019-11-27 DIAGNOSIS — R2689 Other abnormalities of gait and mobility: Secondary | ICD-10-CM | POA: Diagnosis not present

## 2019-11-27 DIAGNOSIS — M6281 Muscle weakness (generalized): Secondary | ICD-10-CM | POA: Diagnosis not present

## 2019-11-28 ENCOUNTER — Telehealth: Payer: Self-pay

## 2019-11-28 DIAGNOSIS — R2689 Other abnormalities of gait and mobility: Secondary | ICD-10-CM | POA: Diagnosis not present

## 2019-11-28 DIAGNOSIS — S065X0D Traumatic subdural hemorrhage without loss of consciousness, subsequent encounter: Secondary | ICD-10-CM | POA: Diagnosis not present

## 2019-11-28 DIAGNOSIS — R296 Repeated falls: Secondary | ICD-10-CM | POA: Diagnosis not present

## 2019-11-28 DIAGNOSIS — R2681 Unsteadiness on feet: Secondary | ICD-10-CM | POA: Diagnosis not present

## 2019-11-28 DIAGNOSIS — R41841 Cognitive communication deficit: Secondary | ICD-10-CM | POA: Diagnosis not present

## 2019-11-28 DIAGNOSIS — M6281 Muscle weakness (generalized): Secondary | ICD-10-CM | POA: Diagnosis not present

## 2019-11-28 NOTE — Telephone Encounter (Signed)
Called to schedule overdue inr appt and the pt's spouse stated that he was admitted to a nursing home [facility] and that she will call us if and when he is able to return

## 2019-11-29 DIAGNOSIS — R41841 Cognitive communication deficit: Secondary | ICD-10-CM | POA: Diagnosis not present

## 2019-11-29 DIAGNOSIS — R2689 Other abnormalities of gait and mobility: Secondary | ICD-10-CM | POA: Diagnosis not present

## 2019-11-29 DIAGNOSIS — M6281 Muscle weakness (generalized): Secondary | ICD-10-CM | POA: Diagnosis not present

## 2019-11-29 DIAGNOSIS — S065X0D Traumatic subdural hemorrhage without loss of consciousness, subsequent encounter: Secondary | ICD-10-CM | POA: Diagnosis not present

## 2019-11-29 DIAGNOSIS — R296 Repeated falls: Secondary | ICD-10-CM | POA: Diagnosis not present

## 2019-11-29 DIAGNOSIS — R2681 Unsteadiness on feet: Secondary | ICD-10-CM | POA: Diagnosis not present

## 2019-11-30 DIAGNOSIS — D519 Vitamin B12 deficiency anemia, unspecified: Secondary | ICD-10-CM | POA: Diagnosis not present

## 2019-11-30 DIAGNOSIS — F0281 Dementia in other diseases classified elsewhere with behavioral disturbance: Secondary | ICD-10-CM | POA: Diagnosis not present

## 2019-11-30 DIAGNOSIS — E785 Hyperlipidemia, unspecified: Secondary | ICD-10-CM | POA: Diagnosis not present

## 2019-11-30 DIAGNOSIS — S065X0D Traumatic subdural hemorrhage without loss of consciousness, subsequent encounter: Secondary | ICD-10-CM | POA: Diagnosis not present

## 2019-11-30 DIAGNOSIS — M6281 Muscle weakness (generalized): Secondary | ICD-10-CM | POA: Diagnosis not present

## 2019-11-30 DIAGNOSIS — R569 Unspecified convulsions: Secondary | ICD-10-CM | POA: Diagnosis not present

## 2019-11-30 DIAGNOSIS — D649 Anemia, unspecified: Secondary | ICD-10-CM | POA: Diagnosis not present

## 2019-11-30 DIAGNOSIS — I110 Hypertensive heart disease with heart failure: Secondary | ICD-10-CM | POA: Diagnosis not present

## 2019-11-30 DIAGNOSIS — D696 Thrombocytopenia, unspecified: Secondary | ICD-10-CM | POA: Diagnosis not present

## 2019-11-30 DIAGNOSIS — R2681 Unsteadiness on feet: Secondary | ICD-10-CM | POA: Diagnosis not present

## 2019-11-30 DIAGNOSIS — R296 Repeated falls: Secondary | ICD-10-CM | POA: Diagnosis not present

## 2019-11-30 DIAGNOSIS — Z79899 Other long term (current) drug therapy: Secondary | ICD-10-CM | POA: Diagnosis not present

## 2019-11-30 DIAGNOSIS — Z954 Presence of other heart-valve replacement: Secondary | ICD-10-CM | POA: Diagnosis not present

## 2019-11-30 DIAGNOSIS — R2689 Other abnormalities of gait and mobility: Secondary | ICD-10-CM | POA: Diagnosis not present

## 2019-11-30 DIAGNOSIS — I509 Heart failure, unspecified: Secondary | ICD-10-CM | POA: Diagnosis not present

## 2019-11-30 DIAGNOSIS — R41841 Cognitive communication deficit: Secondary | ICD-10-CM | POA: Diagnosis not present

## 2019-11-30 DIAGNOSIS — I4891 Unspecified atrial fibrillation: Secondary | ICD-10-CM | POA: Diagnosis not present

## 2019-12-04 DIAGNOSIS — I509 Heart failure, unspecified: Secondary | ICD-10-CM | POA: Diagnosis not present

## 2019-12-04 DIAGNOSIS — R41841 Cognitive communication deficit: Secondary | ICD-10-CM | POA: Diagnosis not present

## 2019-12-04 DIAGNOSIS — N390 Urinary tract infection, site not specified: Secondary | ICD-10-CM | POA: Diagnosis not present

## 2019-12-04 DIAGNOSIS — R2681 Unsteadiness on feet: Secondary | ICD-10-CM | POA: Diagnosis not present

## 2019-12-04 DIAGNOSIS — M6281 Muscle weakness (generalized): Secondary | ICD-10-CM | POA: Diagnosis not present

## 2019-12-04 DIAGNOSIS — R2689 Other abnormalities of gait and mobility: Secondary | ICD-10-CM | POA: Diagnosis not present

## 2019-12-04 DIAGNOSIS — R296 Repeated falls: Secondary | ICD-10-CM | POA: Diagnosis not present

## 2019-12-04 DIAGNOSIS — D649 Anemia, unspecified: Secondary | ICD-10-CM | POA: Diagnosis not present

## 2019-12-04 DIAGNOSIS — R569 Unspecified convulsions: Secondary | ICD-10-CM | POA: Diagnosis not present

## 2019-12-04 DIAGNOSIS — E039 Hypothyroidism, unspecified: Secondary | ICD-10-CM | POA: Diagnosis not present

## 2019-12-04 DIAGNOSIS — S065X0D Traumatic subdural hemorrhage without loss of consciousness, subsequent encounter: Secondary | ICD-10-CM | POA: Diagnosis not present

## 2019-12-05 DIAGNOSIS — R2681 Unsteadiness on feet: Secondary | ICD-10-CM | POA: Diagnosis not present

## 2019-12-05 DIAGNOSIS — S065X0D Traumatic subdural hemorrhage without loss of consciousness, subsequent encounter: Secondary | ICD-10-CM | POA: Diagnosis not present

## 2019-12-05 DIAGNOSIS — R296 Repeated falls: Secondary | ICD-10-CM | POA: Diagnosis not present

## 2019-12-05 DIAGNOSIS — R41841 Cognitive communication deficit: Secondary | ICD-10-CM | POA: Diagnosis not present

## 2019-12-05 DIAGNOSIS — R2689 Other abnormalities of gait and mobility: Secondary | ICD-10-CM | POA: Diagnosis not present

## 2019-12-05 DIAGNOSIS — M6281 Muscle weakness (generalized): Secondary | ICD-10-CM | POA: Diagnosis not present

## 2019-12-06 DIAGNOSIS — R2689 Other abnormalities of gait and mobility: Secondary | ICD-10-CM | POA: Diagnosis not present

## 2019-12-06 DIAGNOSIS — R7611 Nonspecific reaction to tuberculin skin test without active tuberculosis: Secondary | ICD-10-CM | POA: Diagnosis not present

## 2019-12-06 DIAGNOSIS — R2681 Unsteadiness on feet: Secondary | ICD-10-CM | POA: Diagnosis not present

## 2019-12-06 DIAGNOSIS — S065X0D Traumatic subdural hemorrhage without loss of consciousness, subsequent encounter: Secondary | ICD-10-CM | POA: Diagnosis not present

## 2019-12-06 DIAGNOSIS — R41841 Cognitive communication deficit: Secondary | ICD-10-CM | POA: Diagnosis not present

## 2019-12-06 DIAGNOSIS — R296 Repeated falls: Secondary | ICD-10-CM | POA: Diagnosis not present

## 2019-12-06 DIAGNOSIS — M6281 Muscle weakness (generalized): Secondary | ICD-10-CM | POA: Diagnosis not present

## 2019-12-07 DIAGNOSIS — M6281 Muscle weakness (generalized): Secondary | ICD-10-CM | POA: Diagnosis not present

## 2019-12-07 DIAGNOSIS — R2689 Other abnormalities of gait and mobility: Secondary | ICD-10-CM | POA: Diagnosis not present

## 2019-12-07 DIAGNOSIS — S065X0D Traumatic subdural hemorrhage without loss of consciousness, subsequent encounter: Secondary | ICD-10-CM | POA: Diagnosis not present

## 2019-12-07 DIAGNOSIS — R41841 Cognitive communication deficit: Secondary | ICD-10-CM | POA: Diagnosis not present

## 2019-12-07 DIAGNOSIS — R2681 Unsteadiness on feet: Secondary | ICD-10-CM | POA: Diagnosis not present

## 2019-12-07 DIAGNOSIS — R296 Repeated falls: Secondary | ICD-10-CM | POA: Diagnosis not present

## 2019-12-08 DIAGNOSIS — S065X0D Traumatic subdural hemorrhage without loss of consciousness, subsequent encounter: Secondary | ICD-10-CM | POA: Diagnosis not present

## 2019-12-08 DIAGNOSIS — R41841 Cognitive communication deficit: Secondary | ICD-10-CM | POA: Diagnosis not present

## 2019-12-08 DIAGNOSIS — R2681 Unsteadiness on feet: Secondary | ICD-10-CM | POA: Diagnosis not present

## 2019-12-08 DIAGNOSIS — R296 Repeated falls: Secondary | ICD-10-CM | POA: Diagnosis not present

## 2019-12-08 DIAGNOSIS — M6281 Muscle weakness (generalized): Secondary | ICD-10-CM | POA: Diagnosis not present

## 2019-12-08 DIAGNOSIS — R2689 Other abnormalities of gait and mobility: Secondary | ICD-10-CM | POA: Diagnosis not present

## 2019-12-11 DIAGNOSIS — R41841 Cognitive communication deficit: Secondary | ICD-10-CM | POA: Diagnosis not present

## 2019-12-11 DIAGNOSIS — S065X0D Traumatic subdural hemorrhage without loss of consciousness, subsequent encounter: Secondary | ICD-10-CM | POA: Diagnosis not present

## 2019-12-11 DIAGNOSIS — R2681 Unsteadiness on feet: Secondary | ICD-10-CM | POA: Diagnosis not present

## 2019-12-11 DIAGNOSIS — R296 Repeated falls: Secondary | ICD-10-CM | POA: Diagnosis not present

## 2019-12-11 DIAGNOSIS — R2689 Other abnormalities of gait and mobility: Secondary | ICD-10-CM | POA: Diagnosis not present

## 2019-12-11 DIAGNOSIS — M6281 Muscle weakness (generalized): Secondary | ICD-10-CM | POA: Diagnosis not present

## 2019-12-13 DIAGNOSIS — R2681 Unsteadiness on feet: Secondary | ICD-10-CM | POA: Diagnosis not present

## 2019-12-13 DIAGNOSIS — R2689 Other abnormalities of gait and mobility: Secondary | ICD-10-CM | POA: Diagnosis not present

## 2019-12-13 DIAGNOSIS — R296 Repeated falls: Secondary | ICD-10-CM | POA: Diagnosis not present

## 2019-12-13 DIAGNOSIS — R41841 Cognitive communication deficit: Secondary | ICD-10-CM | POA: Diagnosis not present

## 2019-12-13 DIAGNOSIS — F39 Unspecified mood [affective] disorder: Secondary | ICD-10-CM | POA: Diagnosis not present

## 2019-12-13 DIAGNOSIS — M6281 Muscle weakness (generalized): Secondary | ICD-10-CM | POA: Diagnosis not present

## 2019-12-13 DIAGNOSIS — F0390 Unspecified dementia without behavioral disturbance: Secondary | ICD-10-CM | POA: Diagnosis not present

## 2019-12-13 DIAGNOSIS — S065X0D Traumatic subdural hemorrhage without loss of consciousness, subsequent encounter: Secondary | ICD-10-CM | POA: Diagnosis not present

## 2019-12-14 DIAGNOSIS — R2681 Unsteadiness on feet: Secondary | ICD-10-CM | POA: Diagnosis not present

## 2019-12-14 DIAGNOSIS — I4891 Unspecified atrial fibrillation: Secondary | ICD-10-CM | POA: Diagnosis not present

## 2019-12-14 DIAGNOSIS — R296 Repeated falls: Secondary | ICD-10-CM | POA: Diagnosis not present

## 2019-12-14 DIAGNOSIS — S065X0D Traumatic subdural hemorrhage without loss of consciousness, subsequent encounter: Secondary | ICD-10-CM | POA: Diagnosis not present

## 2019-12-14 DIAGNOSIS — R41841 Cognitive communication deficit: Secondary | ICD-10-CM | POA: Diagnosis not present

## 2019-12-14 DIAGNOSIS — Z7901 Long term (current) use of anticoagulants: Secondary | ICD-10-CM | POA: Diagnosis not present

## 2019-12-14 DIAGNOSIS — R2689 Other abnormalities of gait and mobility: Secondary | ICD-10-CM | POA: Diagnosis not present

## 2019-12-14 DIAGNOSIS — M6281 Muscle weakness (generalized): Secondary | ICD-10-CM | POA: Diagnosis not present

## 2019-12-15 DIAGNOSIS — M2550 Pain in unspecified joint: Secondary | ICD-10-CM | POA: Diagnosis not present

## 2019-12-15 DIAGNOSIS — R5381 Other malaise: Secondary | ICD-10-CM | POA: Diagnosis not present

## 2019-12-15 DIAGNOSIS — I2510 Atherosclerotic heart disease of native coronary artery without angina pectoris: Secondary | ICD-10-CM | POA: Diagnosis not present

## 2019-12-15 DIAGNOSIS — I119 Hypertensive heart disease without heart failure: Secondary | ICD-10-CM | POA: Diagnosis not present

## 2019-12-17 DIAGNOSIS — Z7901 Long term (current) use of anticoagulants: Secondary | ICD-10-CM | POA: Diagnosis not present

## 2019-12-17 DIAGNOSIS — I4891 Unspecified atrial fibrillation: Secondary | ICD-10-CM | POA: Diagnosis not present

## 2019-12-19 DIAGNOSIS — Z7901 Long term (current) use of anticoagulants: Secondary | ICD-10-CM | POA: Diagnosis not present

## 2019-12-19 DIAGNOSIS — Z954 Presence of other heart-valve replacement: Secondary | ICD-10-CM | POA: Diagnosis not present

## 2019-12-21 DIAGNOSIS — Z7901 Long term (current) use of anticoagulants: Secondary | ICD-10-CM | POA: Diagnosis not present

## 2019-12-21 DIAGNOSIS — Z954 Presence of other heart-valve replacement: Secondary | ICD-10-CM | POA: Diagnosis not present

## 2019-12-24 DIAGNOSIS — S3993XA Unspecified injury of pelvis, initial encounter: Secondary | ICD-10-CM | POA: Diagnosis not present

## 2019-12-24 DIAGNOSIS — W19XXXA Unspecified fall, initial encounter: Secondary | ICD-10-CM | POA: Diagnosis not present

## 2019-12-24 DIAGNOSIS — Z7401 Bed confinement status: Secondary | ICD-10-CM | POA: Diagnosis not present

## 2019-12-24 DIAGNOSIS — I4891 Unspecified atrial fibrillation: Secondary | ICD-10-CM | POA: Diagnosis not present

## 2019-12-24 DIAGNOSIS — Z043 Encounter for examination and observation following other accident: Secondary | ICD-10-CM | POA: Diagnosis not present

## 2019-12-24 DIAGNOSIS — F0390 Unspecified dementia without behavioral disturbance: Secondary | ICD-10-CM | POA: Diagnosis not present

## 2019-12-24 DIAGNOSIS — M47812 Spondylosis without myelopathy or radiculopathy, cervical region: Secondary | ICD-10-CM | POA: Diagnosis not present

## 2019-12-24 DIAGNOSIS — Z7901 Long term (current) use of anticoagulants: Secondary | ICD-10-CM | POA: Diagnosis not present

## 2019-12-24 DIAGNOSIS — G319 Degenerative disease of nervous system, unspecified: Secondary | ICD-10-CM | POA: Diagnosis not present

## 2019-12-24 DIAGNOSIS — R55 Syncope and collapse: Secondary | ICD-10-CM | POA: Diagnosis not present

## 2019-12-24 DIAGNOSIS — I672 Cerebral atherosclerosis: Secondary | ICD-10-CM | POA: Diagnosis not present

## 2019-12-24 DIAGNOSIS — R410 Disorientation, unspecified: Secondary | ICD-10-CM | POA: Diagnosis not present

## 2019-12-24 DIAGNOSIS — M2550 Pain in unspecified joint: Secondary | ICD-10-CM | POA: Diagnosis not present

## 2019-12-24 DIAGNOSIS — M4802 Spinal stenosis, cervical region: Secondary | ICD-10-CM | POA: Diagnosis not present

## 2019-12-24 DIAGNOSIS — M2578 Osteophyte, vertebrae: Secondary | ICD-10-CM | POA: Diagnosis not present

## 2019-12-25 DIAGNOSIS — S065X0D Traumatic subdural hemorrhage without loss of consciousness, subsequent encounter: Secondary | ICD-10-CM | POA: Diagnosis not present

## 2019-12-25 DIAGNOSIS — R293 Abnormal posture: Secondary | ICD-10-CM | POA: Diagnosis not present

## 2019-12-25 DIAGNOSIS — G309 Alzheimer's disease, unspecified: Secondary | ICD-10-CM | POA: Diagnosis not present

## 2019-12-25 DIAGNOSIS — W19XXXD Unspecified fall, subsequent encounter: Secondary | ICD-10-CM | POA: Diagnosis not present

## 2019-12-25 DIAGNOSIS — M6281 Muscle weakness (generalized): Secondary | ICD-10-CM | POA: Diagnosis not present

## 2019-12-26 DIAGNOSIS — Z7901 Long term (current) use of anticoagulants: Secondary | ICD-10-CM | POA: Diagnosis not present

## 2019-12-26 DIAGNOSIS — I4891 Unspecified atrial fibrillation: Secondary | ICD-10-CM | POA: Diagnosis not present

## 2019-12-27 DIAGNOSIS — G309 Alzheimer's disease, unspecified: Secondary | ICD-10-CM | POA: Diagnosis not present

## 2019-12-27 DIAGNOSIS — S065X0D Traumatic subdural hemorrhage without loss of consciousness, subsequent encounter: Secondary | ICD-10-CM | POA: Diagnosis not present

## 2019-12-27 DIAGNOSIS — W19XXXD Unspecified fall, subsequent encounter: Secondary | ICD-10-CM | POA: Diagnosis not present

## 2019-12-27 DIAGNOSIS — M6281 Muscle weakness (generalized): Secondary | ICD-10-CM | POA: Diagnosis not present

## 2019-12-27 DIAGNOSIS — R293 Abnormal posture: Secondary | ICD-10-CM | POA: Diagnosis not present

## 2019-12-28 DIAGNOSIS — W19XXXD Unspecified fall, subsequent encounter: Secondary | ICD-10-CM | POA: Diagnosis not present

## 2019-12-28 DIAGNOSIS — M79632 Pain in left forearm: Secondary | ICD-10-CM | POA: Diagnosis not present

## 2019-12-28 DIAGNOSIS — I4891 Unspecified atrial fibrillation: Secondary | ICD-10-CM | POA: Diagnosis not present

## 2019-12-28 DIAGNOSIS — Z7901 Long term (current) use of anticoagulants: Secondary | ICD-10-CM | POA: Diagnosis not present

## 2019-12-28 DIAGNOSIS — M79602 Pain in left arm: Secondary | ICD-10-CM | POA: Diagnosis not present

## 2019-12-28 DIAGNOSIS — G309 Alzheimer's disease, unspecified: Secondary | ICD-10-CM | POA: Diagnosis not present

## 2019-12-28 DIAGNOSIS — R293 Abnormal posture: Secondary | ICD-10-CM | POA: Diagnosis not present

## 2019-12-28 DIAGNOSIS — M6281 Muscle weakness (generalized): Secondary | ICD-10-CM | POA: Diagnosis not present

## 2019-12-28 DIAGNOSIS — M25522 Pain in left elbow: Secondary | ICD-10-CM | POA: Diagnosis not present

## 2019-12-28 DIAGNOSIS — M25512 Pain in left shoulder: Secondary | ICD-10-CM | POA: Diagnosis not present

## 2019-12-28 DIAGNOSIS — S065X0D Traumatic subdural hemorrhage without loss of consciousness, subsequent encounter: Secondary | ICD-10-CM | POA: Diagnosis not present

## 2019-12-29 DIAGNOSIS — M25532 Pain in left wrist: Secondary | ICD-10-CM | POA: Diagnosis not present

## 2019-12-29 DIAGNOSIS — M79642 Pain in left hand: Secondary | ICD-10-CM | POA: Diagnosis not present

## 2019-12-31 DIAGNOSIS — M25511 Pain in right shoulder: Secondary | ICD-10-CM | POA: Diagnosis not present

## 2019-12-31 DIAGNOSIS — M542 Cervicalgia: Secondary | ICD-10-CM | POA: Diagnosis not present

## 2019-12-31 DIAGNOSIS — M545 Low back pain: Secondary | ICD-10-CM | POA: Diagnosis not present

## 2020-01-07 DIAGNOSIS — R293 Abnormal posture: Secondary | ICD-10-CM | POA: Diagnosis not present

## 2020-01-07 DIAGNOSIS — G309 Alzheimer's disease, unspecified: Secondary | ICD-10-CM | POA: Diagnosis not present

## 2020-01-07 DIAGNOSIS — W19XXXD Unspecified fall, subsequent encounter: Secondary | ICD-10-CM | POA: Diagnosis not present

## 2020-01-07 DIAGNOSIS — S065X0D Traumatic subdural hemorrhage without loss of consciousness, subsequent encounter: Secondary | ICD-10-CM | POA: Diagnosis not present

## 2020-01-07 DIAGNOSIS — M6281 Muscle weakness (generalized): Secondary | ICD-10-CM | POA: Diagnosis not present

## 2020-01-08 ENCOUNTER — Telehealth: Payer: Self-pay

## 2020-01-08 NOTE — Telephone Encounter (Signed)
Called pt's wife to schedule inr but they have been admitted to ursing home for longterm stay

## 2020-01-08 NOTE — Telephone Encounter (Signed)
-----   Message from Pearletha Furl, RPH-CPP sent at 01/08/2020  8:24 AM EDT ----- Regarding: Overdue INR Call patient to re-schedule f/u INR check.   Close INR encounter if patient admitted to SNF for indefinite stay.

## 2020-01-10 DIAGNOSIS — R293 Abnormal posture: Secondary | ICD-10-CM | POA: Diagnosis not present

## 2020-01-10 DIAGNOSIS — S065X0D Traumatic subdural hemorrhage without loss of consciousness, subsequent encounter: Secondary | ICD-10-CM | POA: Diagnosis not present

## 2020-01-10 DIAGNOSIS — W19XXXD Unspecified fall, subsequent encounter: Secondary | ICD-10-CM | POA: Diagnosis not present

## 2020-01-10 DIAGNOSIS — G309 Alzheimer's disease, unspecified: Secondary | ICD-10-CM | POA: Diagnosis not present

## 2020-01-10 DIAGNOSIS — M6281 Muscle weakness (generalized): Secondary | ICD-10-CM | POA: Diagnosis not present

## 2020-01-11 DIAGNOSIS — Z7901 Long term (current) use of anticoagulants: Secondary | ICD-10-CM | POA: Diagnosis not present

## 2020-01-11 DIAGNOSIS — Z954 Presence of other heart-valve replacement: Secondary | ICD-10-CM | POA: Diagnosis not present

## 2020-01-12 DIAGNOSIS — Z7901 Long term (current) use of anticoagulants: Secondary | ICD-10-CM | POA: Diagnosis not present

## 2020-01-12 DIAGNOSIS — Z954 Presence of other heart-valve replacement: Secondary | ICD-10-CM | POA: Diagnosis not present

## 2020-01-14 DIAGNOSIS — R5381 Other malaise: Secondary | ICD-10-CM | POA: Diagnosis not present

## 2020-01-14 DIAGNOSIS — D649 Anemia, unspecified: Secondary | ICD-10-CM | POA: Diagnosis not present

## 2020-01-14 DIAGNOSIS — I2510 Atherosclerotic heart disease of native coronary artery without angina pectoris: Secondary | ICD-10-CM | POA: Diagnosis not present

## 2020-01-14 DIAGNOSIS — R1310 Dysphagia, unspecified: Secondary | ICD-10-CM | POA: Diagnosis not present

## 2020-01-15 DIAGNOSIS — Z954 Presence of other heart-valve replacement: Secondary | ICD-10-CM | POA: Diagnosis not present

## 2020-01-15 DIAGNOSIS — Z7901 Long term (current) use of anticoagulants: Secondary | ICD-10-CM | POA: Diagnosis not present

## 2020-01-22 DIAGNOSIS — Z954 Presence of other heart-valve replacement: Secondary | ICD-10-CM | POA: Diagnosis not present

## 2020-01-24 DIAGNOSIS — F0390 Unspecified dementia without behavioral disturbance: Secondary | ICD-10-CM | POA: Diagnosis not present

## 2020-01-24 DIAGNOSIS — F39 Unspecified mood [affective] disorder: Secondary | ICD-10-CM | POA: Diagnosis not present

## 2020-01-28 DIAGNOSIS — Z954 Presence of other heart-valve replacement: Secondary | ICD-10-CM | POA: Diagnosis not present

## 2020-02-04 DIAGNOSIS — Z954 Presence of other heart-valve replacement: Secondary | ICD-10-CM | POA: Diagnosis not present

## 2020-02-11 DIAGNOSIS — Z7901 Long term (current) use of anticoagulants: Secondary | ICD-10-CM | POA: Diagnosis not present

## 2020-02-13 DIAGNOSIS — M549 Dorsalgia, unspecified: Secondary | ICD-10-CM | POA: Diagnosis not present

## 2020-02-13 DIAGNOSIS — F0391 Unspecified dementia with behavioral disturbance: Secondary | ICD-10-CM | POA: Diagnosis not present

## 2020-02-13 DIAGNOSIS — I2510 Atherosclerotic heart disease of native coronary artery without angina pectoris: Secondary | ICD-10-CM | POA: Diagnosis not present

## 2020-02-13 DIAGNOSIS — I119 Hypertensive heart disease without heart failure: Secondary | ICD-10-CM | POA: Diagnosis not present

## 2020-02-15 DIAGNOSIS — Z954 Presence of other heart-valve replacement: Secondary | ICD-10-CM | POA: Diagnosis not present

## 2020-02-29 DIAGNOSIS — I4891 Unspecified atrial fibrillation: Secondary | ICD-10-CM | POA: Diagnosis not present

## 2020-03-03 ENCOUNTER — Telehealth: Payer: Self-pay | Admitting: Emergency Medicine

## 2020-03-03 ENCOUNTER — Telehealth: Payer: Medicare Other | Admitting: Neurology

## 2020-03-03 DIAGNOSIS — Z7901 Long term (current) use of anticoagulants: Secondary | ICD-10-CM | POA: Diagnosis not present

## 2020-03-03 DIAGNOSIS — D509 Iron deficiency anemia, unspecified: Secondary | ICD-10-CM | POA: Diagnosis not present

## 2020-03-03 NOTE — Telephone Encounter (Signed)
Called and rescheduled patient's video MyChart Visit to 03/06/20 @ 1600.  Patient's wife stated she will get her kids to fix her ipad so that she will be able to log in by that time.  I asked her to please call and let us know if she had any problems or would not be able to make that visit due to difficulties.  Patient agreed and expressed appreciation.

## 2020-03-04 DIAGNOSIS — S91301A Unspecified open wound, right foot, initial encounter: Secondary | ICD-10-CM | POA: Diagnosis not present

## 2020-03-06 ENCOUNTER — Telehealth: Payer: Self-pay | Admitting: Neurology

## 2020-03-06 DIAGNOSIS — R58 Hemorrhage, not elsewhere classified: Secondary | ICD-10-CM | POA: Diagnosis not present

## 2020-03-06 DIAGNOSIS — R1030 Lower abdominal pain, unspecified: Secondary | ICD-10-CM | POA: Diagnosis not present

## 2020-03-06 DIAGNOSIS — R0902 Hypoxemia: Secondary | ICD-10-CM | POA: Diagnosis not present

## 2020-03-06 DIAGNOSIS — K5900 Constipation, unspecified: Secondary | ICD-10-CM | POA: Diagnosis not present

## 2020-03-06 DIAGNOSIS — D649 Anemia, unspecified: Secondary | ICD-10-CM | POA: Diagnosis present

## 2020-03-06 DIAGNOSIS — K626 Ulcer of anus and rectum: Secondary | ICD-10-CM | POA: Diagnosis not present

## 2020-03-06 DIAGNOSIS — R791 Abnormal coagulation profile: Secondary | ICD-10-CM | POA: Diagnosis not present

## 2020-03-06 DIAGNOSIS — R195 Other fecal abnormalities: Secondary | ICD-10-CM | POA: Diagnosis not present

## 2020-03-06 DIAGNOSIS — M2550 Pain in unspecified joint: Secondary | ICD-10-CM | POA: Diagnosis not present

## 2020-03-06 DIAGNOSIS — Z8249 Family history of ischemic heart disease and other diseases of the circulatory system: Secondary | ICD-10-CM | POA: Diagnosis not present

## 2020-03-06 DIAGNOSIS — K921 Melena: Secondary | ICD-10-CM | POA: Diagnosis present

## 2020-03-06 DIAGNOSIS — R4182 Altered mental status, unspecified: Secondary | ICD-10-CM | POA: Diagnosis not present

## 2020-03-06 DIAGNOSIS — K5641 Fecal impaction: Secondary | ICD-10-CM | POA: Diagnosis present

## 2020-03-06 DIAGNOSIS — F0280 Dementia in other diseases classified elsewhere without behavioral disturbance: Secondary | ICD-10-CM | POA: Diagnosis present

## 2020-03-06 DIAGNOSIS — Z7401 Bed confinement status: Secondary | ICD-10-CM | POA: Diagnosis not present

## 2020-03-06 DIAGNOSIS — G309 Alzheimer's disease, unspecified: Secondary | ICD-10-CM | POA: Diagnosis present

## 2020-03-06 DIAGNOSIS — E785 Hyperlipidemia, unspecified: Secondary | ICD-10-CM | POA: Diagnosis present

## 2020-03-06 DIAGNOSIS — Z888 Allergy status to other drugs, medicaments and biological substances status: Secondary | ICD-10-CM | POA: Diagnosis not present

## 2020-03-06 DIAGNOSIS — I509 Heart failure, unspecified: Secondary | ICD-10-CM | POA: Diagnosis present

## 2020-03-06 DIAGNOSIS — Z951 Presence of aortocoronary bypass graft: Secondary | ICD-10-CM | POA: Diagnosis not present

## 2020-03-06 DIAGNOSIS — I2510 Atherosclerotic heart disease of native coronary artery without angina pectoris: Secondary | ICD-10-CM | POA: Diagnosis present

## 2020-03-06 DIAGNOSIS — Z7901 Long term (current) use of anticoagulants: Secondary | ICD-10-CM | POA: Diagnosis not present

## 2020-03-06 DIAGNOSIS — F0391 Unspecified dementia with behavioral disturbance: Secondary | ICD-10-CM | POA: Diagnosis not present

## 2020-03-06 DIAGNOSIS — Z952 Presence of prosthetic heart valve: Secondary | ICD-10-CM | POA: Diagnosis not present

## 2020-03-06 DIAGNOSIS — Z515 Encounter for palliative care: Secondary | ICD-10-CM | POA: Diagnosis not present

## 2020-03-06 DIAGNOSIS — R000 Tachycardia, unspecified: Secondary | ICD-10-CM | POA: Diagnosis not present

## 2020-03-06 DIAGNOSIS — K625 Hemorrhage of anus and rectum: Secondary | ICD-10-CM | POA: Diagnosis not present

## 2020-03-06 DIAGNOSIS — Z945 Skin transplant status: Secondary | ICD-10-CM | POA: Diagnosis not present

## 2020-03-06 DIAGNOSIS — Z20822 Contact with and (suspected) exposure to covid-19: Secondary | ICD-10-CM | POA: Diagnosis present

## 2020-03-06 DIAGNOSIS — Z880 Allergy status to penicillin: Secondary | ICD-10-CM | POA: Diagnosis not present

## 2020-03-06 DIAGNOSIS — F0151 Vascular dementia with behavioral disturbance: Secondary | ICD-10-CM | POA: Diagnosis not present

## 2020-03-06 DIAGNOSIS — I10 Essential (primary) hypertension: Secondary | ICD-10-CM | POA: Diagnosis not present

## 2020-03-06 DIAGNOSIS — Z954 Presence of other heart-valve replacement: Secondary | ICD-10-CM | POA: Diagnosis not present

## 2020-03-06 DIAGNOSIS — I110 Hypertensive heart disease with heart failure: Secondary | ICD-10-CM | POA: Diagnosis present

## 2020-03-06 DIAGNOSIS — Z66 Do not resuscitate: Secondary | ICD-10-CM | POA: Diagnosis present

## 2020-03-06 DIAGNOSIS — K922 Gastrointestinal hemorrhage, unspecified: Secondary | ICD-10-CM | POA: Diagnosis not present

## 2020-03-06 DIAGNOSIS — R10817 Generalized abdominal tenderness: Secondary | ICD-10-CM | POA: Diagnosis not present

## 2020-03-10 DIAGNOSIS — F0390 Unspecified dementia without behavioral disturbance: Secondary | ICD-10-CM | POA: Diagnosis not present

## 2020-03-10 DIAGNOSIS — D696 Thrombocytopenia, unspecified: Secondary | ICD-10-CM | POA: Diagnosis not present

## 2020-03-10 DIAGNOSIS — F39 Unspecified mood [affective] disorder: Secondary | ICD-10-CM | POA: Diagnosis not present

## 2020-03-11 DIAGNOSIS — S91301D Unspecified open wound, right foot, subsequent encounter: Secondary | ICD-10-CM | POA: Diagnosis not present

## 2020-03-13 DIAGNOSIS — K922 Gastrointestinal hemorrhage, unspecified: Secondary | ICD-10-CM | POA: Diagnosis not present

## 2020-03-13 DIAGNOSIS — K5900 Constipation, unspecified: Secondary | ICD-10-CM | POA: Diagnosis not present

## 2020-03-13 DIAGNOSIS — R5381 Other malaise: Secondary | ICD-10-CM | POA: Diagnosis not present

## 2020-03-13 DIAGNOSIS — D649 Anemia, unspecified: Secondary | ICD-10-CM | POA: Diagnosis not present

## 2020-03-15 DIAGNOSIS — R059 Cough, unspecified: Secondary | ICD-10-CM | POA: Diagnosis not present

## 2020-03-17 DIAGNOSIS — Z954 Presence of other heart-valve replacement: Secondary | ICD-10-CM | POA: Diagnosis not present

## 2020-03-18 DIAGNOSIS — S91301D Unspecified open wound, right foot, subsequent encounter: Secondary | ICD-10-CM | POA: Diagnosis not present

## 2020-03-24 DIAGNOSIS — Z7901 Long term (current) use of anticoagulants: Secondary | ICD-10-CM | POA: Diagnosis not present

## 2020-03-24 DIAGNOSIS — Z954 Presence of other heart-valve replacement: Secondary | ICD-10-CM | POA: Diagnosis not present

## 2020-04-07 DIAGNOSIS — D696 Thrombocytopenia, unspecified: Secondary | ICD-10-CM | POA: Diagnosis not present

## 2020-04-09 DIAGNOSIS — D696 Thrombocytopenia, unspecified: Secondary | ICD-10-CM | POA: Diagnosis not present

## 2020-04-09 DIAGNOSIS — Z954 Presence of other heart-valve replacement: Secondary | ICD-10-CM | POA: Diagnosis not present

## 2020-04-09 DIAGNOSIS — Z7901 Long term (current) use of anticoagulants: Secondary | ICD-10-CM | POA: Diagnosis not present

## 2020-04-11 DIAGNOSIS — D696 Thrombocytopenia, unspecified: Secondary | ICD-10-CM | POA: Diagnosis not present

## 2020-04-12 DIAGNOSIS — I4891 Unspecified atrial fibrillation: Secondary | ICD-10-CM | POA: Diagnosis not present

## 2020-04-12 DIAGNOSIS — D509 Iron deficiency anemia, unspecified: Secondary | ICD-10-CM | POA: Diagnosis not present

## 2020-04-12 DIAGNOSIS — Z7901 Long term (current) use of anticoagulants: Secondary | ICD-10-CM | POA: Diagnosis not present

## 2020-04-12 DIAGNOSIS — D696 Thrombocytopenia, unspecified: Secondary | ICD-10-CM | POA: Diagnosis not present

## 2020-04-14 DIAGNOSIS — R5381 Other malaise: Secondary | ICD-10-CM | POA: Diagnosis not present

## 2020-04-14 DIAGNOSIS — I2510 Atherosclerotic heart disease of native coronary artery without angina pectoris: Secondary | ICD-10-CM | POA: Diagnosis not present

## 2020-04-14 DIAGNOSIS — R1310 Dysphagia, unspecified: Secondary | ICD-10-CM | POA: Diagnosis not present

## 2020-04-14 DIAGNOSIS — D649 Anemia, unspecified: Secondary | ICD-10-CM | POA: Diagnosis not present

## 2020-04-16 DIAGNOSIS — I4891 Unspecified atrial fibrillation: Secondary | ICD-10-CM | POA: Diagnosis not present

## 2020-04-17 DIAGNOSIS — F0390 Unspecified dementia without behavioral disturbance: Secondary | ICD-10-CM | POA: Diagnosis not present

## 2020-04-17 DIAGNOSIS — F39 Unspecified mood [affective] disorder: Secondary | ICD-10-CM | POA: Diagnosis not present

## 2020-04-17 DIAGNOSIS — Z954 Presence of other heart-valve replacement: Secondary | ICD-10-CM | POA: Diagnosis not present

## 2020-04-17 DIAGNOSIS — I4891 Unspecified atrial fibrillation: Secondary | ICD-10-CM | POA: Diagnosis not present

## 2020-04-20 DIAGNOSIS — I4891 Unspecified atrial fibrillation: Secondary | ICD-10-CM | POA: Diagnosis not present

## 2020-04-23 DIAGNOSIS — I110 Hypertensive heart disease with heart failure: Secondary | ICD-10-CM | POA: Diagnosis not present

## 2020-04-23 DIAGNOSIS — I4891 Unspecified atrial fibrillation: Secondary | ICD-10-CM | POA: Diagnosis not present

## 2020-04-30 DIAGNOSIS — Z954 Presence of other heart-valve replacement: Secondary | ICD-10-CM | POA: Diagnosis not present

## 2020-05-02 DIAGNOSIS — I4891 Unspecified atrial fibrillation: Secondary | ICD-10-CM | POA: Diagnosis not present

## 2020-05-05 DIAGNOSIS — Z954 Presence of other heart-valve replacement: Secondary | ICD-10-CM | POA: Diagnosis not present

## 2020-05-08 DIAGNOSIS — Z954 Presence of other heart-valve replacement: Secondary | ICD-10-CM | POA: Diagnosis not present

## 2020-05-11 DIAGNOSIS — Z954 Presence of other heart-valve replacement: Secondary | ICD-10-CM | POA: Diagnosis not present

## 2020-05-14 DIAGNOSIS — K5900 Constipation, unspecified: Secondary | ICD-10-CM | POA: Diagnosis not present

## 2020-05-14 DIAGNOSIS — R5381 Other malaise: Secondary | ICD-10-CM | POA: Diagnosis not present

## 2020-05-14 DIAGNOSIS — D649 Anemia, unspecified: Secondary | ICD-10-CM | POA: Diagnosis not present

## 2020-05-14 DIAGNOSIS — I119 Hypertensive heart disease without heart failure: Secondary | ICD-10-CM | POA: Diagnosis not present

## 2020-05-15 DIAGNOSIS — Z954 Presence of other heart-valve replacement: Secondary | ICD-10-CM | POA: Diagnosis not present

## 2020-05-16 DIAGNOSIS — J811 Chronic pulmonary edema: Secondary | ICD-10-CM | POA: Diagnosis not present

## 2020-05-16 DIAGNOSIS — D509 Iron deficiency anemia, unspecified: Secondary | ICD-10-CM | POA: Diagnosis not present

## 2020-05-16 DIAGNOSIS — I110 Hypertensive heart disease with heart failure: Secondary | ICD-10-CM | POA: Diagnosis not present

## 2020-05-22 DIAGNOSIS — Z954 Presence of other heart-valve replacement: Secondary | ICD-10-CM | POA: Diagnosis not present

## 2020-05-29 DIAGNOSIS — E785 Hyperlipidemia, unspecified: Secondary | ICD-10-CM | POA: Diagnosis not present

## 2020-05-29 DIAGNOSIS — Z7901 Long term (current) use of anticoagulants: Secondary | ICD-10-CM | POA: Diagnosis not present

## 2020-05-29 DIAGNOSIS — E039 Hypothyroidism, unspecified: Secondary | ICD-10-CM | POA: Diagnosis not present

## 2020-05-29 DIAGNOSIS — D509 Iron deficiency anemia, unspecified: Secondary | ICD-10-CM | POA: Diagnosis not present

## 2020-05-29 DIAGNOSIS — D696 Thrombocytopenia, unspecified: Secondary | ICD-10-CM | POA: Diagnosis not present

## 2020-05-29 DIAGNOSIS — I4891 Unspecified atrial fibrillation: Secondary | ICD-10-CM | POA: Diagnosis not present

## 2020-06-04 DIAGNOSIS — R109 Unspecified abdominal pain: Secondary | ICD-10-CM | POA: Diagnosis not present

## 2020-06-04 DIAGNOSIS — R079 Chest pain, unspecified: Secondary | ICD-10-CM | POA: Diagnosis not present

## 2020-06-05 DIAGNOSIS — Z954 Presence of other heart-valve replacement: Secondary | ICD-10-CM | POA: Diagnosis not present

## 2020-06-06 DIAGNOSIS — F0390 Unspecified dementia without behavioral disturbance: Secondary | ICD-10-CM | POA: Diagnosis not present

## 2020-06-06 DIAGNOSIS — F39 Unspecified mood [affective] disorder: Secondary | ICD-10-CM | POA: Diagnosis not present

## 2020-06-09 DIAGNOSIS — Z954 Presence of other heart-valve replacement: Secondary | ICD-10-CM | POA: Diagnosis not present

## 2020-06-10 DIAGNOSIS — N390 Urinary tract infection, site not specified: Secondary | ICD-10-CM | POA: Diagnosis not present

## 2020-06-11 DIAGNOSIS — R109 Unspecified abdominal pain: Secondary | ICD-10-CM | POA: Diagnosis not present

## 2020-06-11 DIAGNOSIS — R079 Chest pain, unspecified: Secondary | ICD-10-CM | POA: Diagnosis not present

## 2020-06-14 DIAGNOSIS — E441 Mild protein-calorie malnutrition: Secondary | ICD-10-CM | POA: Diagnosis not present

## 2020-06-14 DIAGNOSIS — F0391 Unspecified dementia with behavioral disturbance: Secondary | ICD-10-CM | POA: Diagnosis not present

## 2020-06-14 DIAGNOSIS — M2550 Pain in unspecified joint: Secondary | ICD-10-CM | POA: Diagnosis not present

## 2020-06-14 DIAGNOSIS — I119 Hypertensive heart disease without heart failure: Secondary | ICD-10-CM | POA: Diagnosis not present

## 2020-06-16 DIAGNOSIS — I4891 Unspecified atrial fibrillation: Secondary | ICD-10-CM | POA: Diagnosis not present

## 2020-06-23 DIAGNOSIS — Z954 Presence of other heart-valve replacement: Secondary | ICD-10-CM | POA: Diagnosis not present

## 2020-06-30 DIAGNOSIS — Z7901 Long term (current) use of anticoagulants: Secondary | ICD-10-CM | POA: Diagnosis not present

## 2020-07-08 DIAGNOSIS — Z954 Presence of other heart-valve replacement: Secondary | ICD-10-CM | POA: Diagnosis not present

## 2020-07-10 DIAGNOSIS — F0390 Unspecified dementia without behavioral disturbance: Secondary | ICD-10-CM | POA: Diagnosis not present

## 2020-07-10 DIAGNOSIS — F39 Unspecified mood [affective] disorder: Secondary | ICD-10-CM | POA: Diagnosis not present

## 2020-07-14 DIAGNOSIS — F0391 Unspecified dementia with behavioral disturbance: Secondary | ICD-10-CM | POA: Diagnosis not present

## 2020-07-14 DIAGNOSIS — M2550 Pain in unspecified joint: Secondary | ICD-10-CM | POA: Diagnosis not present

## 2020-07-14 DIAGNOSIS — R5381 Other malaise: Secondary | ICD-10-CM | POA: Diagnosis not present

## 2020-07-14 DIAGNOSIS — E441 Mild protein-calorie malnutrition: Secondary | ICD-10-CM | POA: Diagnosis not present

## 2020-07-15 DIAGNOSIS — D509 Iron deficiency anemia, unspecified: Secondary | ICD-10-CM | POA: Diagnosis not present

## 2020-07-15 DIAGNOSIS — I4891 Unspecified atrial fibrillation: Secondary | ICD-10-CM | POA: Diagnosis not present

## 2020-07-15 DIAGNOSIS — D696 Thrombocytopenia, unspecified: Secondary | ICD-10-CM | POA: Diagnosis not present

## 2020-07-15 DIAGNOSIS — Z7901 Long term (current) use of anticoagulants: Secondary | ICD-10-CM | POA: Diagnosis not present

## 2020-07-28 DIAGNOSIS — I4891 Unspecified atrial fibrillation: Secondary | ICD-10-CM | POA: Diagnosis not present

## 2020-08-11 DIAGNOSIS — R5381 Other malaise: Secondary | ICD-10-CM | POA: Diagnosis not present

## 2020-08-11 DIAGNOSIS — D649 Anemia, unspecified: Secondary | ICD-10-CM | POA: Diagnosis not present

## 2020-08-11 DIAGNOSIS — R1310 Dysphagia, unspecified: Secondary | ICD-10-CM | POA: Diagnosis not present

## 2020-08-11 DIAGNOSIS — I2510 Atherosclerotic heart disease of native coronary artery without angina pectoris: Secondary | ICD-10-CM | POA: Diagnosis not present

## 2020-08-11 DIAGNOSIS — Z954 Presence of other heart-valve replacement: Secondary | ICD-10-CM | POA: Diagnosis not present

## 2020-08-21 DIAGNOSIS — F39 Unspecified mood [affective] disorder: Secondary | ICD-10-CM | POA: Diagnosis not present

## 2020-08-21 DIAGNOSIS — F0390 Unspecified dementia without behavioral disturbance: Secondary | ICD-10-CM | POA: Diagnosis not present

## 2020-08-25 DIAGNOSIS — I4891 Unspecified atrial fibrillation: Secondary | ICD-10-CM | POA: Diagnosis not present

## 2020-09-08 DIAGNOSIS — Z954 Presence of other heart-valve replacement: Secondary | ICD-10-CM | POA: Diagnosis not present

## 2020-09-10 DIAGNOSIS — Z954 Presence of other heart-valve replacement: Secondary | ICD-10-CM | POA: Diagnosis not present

## 2020-09-12 DIAGNOSIS — D649 Anemia, unspecified: Secondary | ICD-10-CM | POA: Diagnosis not present

## 2020-09-12 DIAGNOSIS — K5900 Constipation, unspecified: Secondary | ICD-10-CM | POA: Diagnosis not present

## 2020-09-12 DIAGNOSIS — R5381 Other malaise: Secondary | ICD-10-CM | POA: Diagnosis not present

## 2020-09-12 DIAGNOSIS — E441 Mild protein-calorie malnutrition: Secondary | ICD-10-CM | POA: Diagnosis not present

## 2020-09-15 DIAGNOSIS — Z954 Presence of other heart-valve replacement: Secondary | ICD-10-CM | POA: Diagnosis not present

## 2020-09-17 DIAGNOSIS — Z954 Presence of other heart-valve replacement: Secondary | ICD-10-CM | POA: Diagnosis not present

## 2020-09-17 DIAGNOSIS — I4891 Unspecified atrial fibrillation: Secondary | ICD-10-CM | POA: Diagnosis not present

## 2020-09-22 DIAGNOSIS — Z954 Presence of other heart-valve replacement: Secondary | ICD-10-CM | POA: Diagnosis not present

## 2020-09-22 DIAGNOSIS — I4891 Unspecified atrial fibrillation: Secondary | ICD-10-CM | POA: Diagnosis not present

## 2020-09-29 DIAGNOSIS — I4891 Unspecified atrial fibrillation: Secondary | ICD-10-CM | POA: Diagnosis not present

## 2020-09-29 DIAGNOSIS — Z7901 Long term (current) use of anticoagulants: Secondary | ICD-10-CM | POA: Diagnosis not present

## 2020-09-29 DIAGNOSIS — Z954 Presence of other heart-valve replacement: Secondary | ICD-10-CM | POA: Diagnosis not present

## 2020-10-06 DIAGNOSIS — I4891 Unspecified atrial fibrillation: Secondary | ICD-10-CM | POA: Diagnosis not present

## 2020-10-13 DIAGNOSIS — F0391 Unspecified dementia with behavioral disturbance: Secondary | ICD-10-CM | POA: Diagnosis not present

## 2020-10-13 DIAGNOSIS — E785 Hyperlipidemia, unspecified: Secondary | ICD-10-CM | POA: Diagnosis not present

## 2020-10-13 DIAGNOSIS — I119 Hypertensive heart disease without heart failure: Secondary | ICD-10-CM | POA: Diagnosis not present

## 2020-10-13 DIAGNOSIS — M2550 Pain in unspecified joint: Secondary | ICD-10-CM | POA: Diagnosis not present

## 2020-10-20 DIAGNOSIS — I4891 Unspecified atrial fibrillation: Secondary | ICD-10-CM | POA: Diagnosis not present

## 2020-10-22 DIAGNOSIS — I484 Atypical atrial flutter: Secondary | ICD-10-CM | POA: Diagnosis not present

## 2020-10-23 DIAGNOSIS — F39 Unspecified mood [affective] disorder: Secondary | ICD-10-CM | POA: Diagnosis not present

## 2020-10-23 DIAGNOSIS — F0390 Unspecified dementia without behavioral disturbance: Secondary | ICD-10-CM | POA: Diagnosis not present

## 2020-10-25 DIAGNOSIS — I4891 Unspecified atrial fibrillation: Secondary | ICD-10-CM | POA: Diagnosis not present

## 2020-10-31 DIAGNOSIS — Z7901 Long term (current) use of anticoagulants: Secondary | ICD-10-CM | POA: Diagnosis not present

## 2020-11-07 DIAGNOSIS — I4891 Unspecified atrial fibrillation: Secondary | ICD-10-CM | POA: Diagnosis not present

## 2020-11-11 DIAGNOSIS — Z23 Encounter for immunization: Secondary | ICD-10-CM | POA: Diagnosis not present

## 2020-11-12 DIAGNOSIS — D649 Anemia, unspecified: Secondary | ICD-10-CM | POA: Diagnosis not present

## 2020-11-12 DIAGNOSIS — F0391 Unspecified dementia with behavioral disturbance: Secondary | ICD-10-CM | POA: Diagnosis not present

## 2020-11-12 DIAGNOSIS — I119 Hypertensive heart disease without heart failure: Secondary | ICD-10-CM | POA: Diagnosis not present

## 2020-11-12 DIAGNOSIS — R1310 Dysphagia, unspecified: Secondary | ICD-10-CM | POA: Diagnosis not present

## 2020-11-21 DIAGNOSIS — D696 Thrombocytopenia, unspecified: Secondary | ICD-10-CM | POA: Diagnosis not present

## 2020-11-21 DIAGNOSIS — Z7901 Long term (current) use of anticoagulants: Secondary | ICD-10-CM | POA: Diagnosis not present

## 2020-11-22 DIAGNOSIS — Z7901 Long term (current) use of anticoagulants: Secondary | ICD-10-CM | POA: Diagnosis not present

## 2020-12-08 DIAGNOSIS — Z7901 Long term (current) use of anticoagulants: Secondary | ICD-10-CM | POA: Diagnosis not present

## 2020-12-12 DIAGNOSIS — E441 Mild protein-calorie malnutrition: Secondary | ICD-10-CM | POA: Diagnosis not present

## 2020-12-12 DIAGNOSIS — I2510 Atherosclerotic heart disease of native coronary artery without angina pectoris: Secondary | ICD-10-CM | POA: Diagnosis not present

## 2020-12-12 DIAGNOSIS — F0391 Unspecified dementia with behavioral disturbance: Secondary | ICD-10-CM | POA: Diagnosis not present

## 2020-12-12 DIAGNOSIS — R1310 Dysphagia, unspecified: Secondary | ICD-10-CM | POA: Diagnosis not present

## 2020-12-16 DIAGNOSIS — I4891 Unspecified atrial fibrillation: Secondary | ICD-10-CM | POA: Diagnosis not present

## 2020-12-22 DIAGNOSIS — I509 Heart failure, unspecified: Secondary | ICD-10-CM | POA: Diagnosis not present

## 2020-12-29 DIAGNOSIS — I4891 Unspecified atrial fibrillation: Secondary | ICD-10-CM | POA: Diagnosis not present

## 2020-12-30 DIAGNOSIS — K909 Intestinal malabsorption, unspecified: Secondary | ICD-10-CM | POA: Diagnosis not present

## 2020-12-30 DIAGNOSIS — E785 Hyperlipidemia, unspecified: Secondary | ICD-10-CM | POA: Diagnosis not present

## 2020-12-31 DIAGNOSIS — Z954 Presence of other heart-valve replacement: Secondary | ICD-10-CM | POA: Diagnosis not present

## 2021-01-03 DIAGNOSIS — E039 Hypothyroidism, unspecified: Secondary | ICD-10-CM | POA: Diagnosis not present

## 2021-01-07 DIAGNOSIS — I4891 Unspecified atrial fibrillation: Secondary | ICD-10-CM | POA: Diagnosis not present

## 2021-01-10 DIAGNOSIS — Z7901 Long term (current) use of anticoagulants: Secondary | ICD-10-CM | POA: Diagnosis not present

## 2021-01-12 DIAGNOSIS — R5381 Other malaise: Secondary | ICD-10-CM | POA: Diagnosis not present

## 2021-01-12 DIAGNOSIS — K5900 Constipation, unspecified: Secondary | ICD-10-CM | POA: Diagnosis not present

## 2021-01-12 DIAGNOSIS — I119 Hypertensive heart disease without heart failure: Secondary | ICD-10-CM | POA: Diagnosis not present

## 2021-01-12 DIAGNOSIS — D649 Anemia, unspecified: Secondary | ICD-10-CM | POA: Diagnosis not present

## 2021-01-15 DIAGNOSIS — F0390 Unspecified dementia without behavioral disturbance: Secondary | ICD-10-CM | POA: Diagnosis not present

## 2021-01-15 DIAGNOSIS — F39 Unspecified mood [affective] disorder: Secondary | ICD-10-CM | POA: Diagnosis not present

## 2021-01-17 DIAGNOSIS — I4891 Unspecified atrial fibrillation: Secondary | ICD-10-CM | POA: Diagnosis not present

## 2021-01-24 DIAGNOSIS — R791 Abnormal coagulation profile: Secondary | ICD-10-CM | POA: Diagnosis not present

## 2021-01-26 DIAGNOSIS — R059 Cough, unspecified: Secondary | ICD-10-CM | POA: Diagnosis not present

## 2021-01-30 DIAGNOSIS — I4891 Unspecified atrial fibrillation: Secondary | ICD-10-CM | POA: Diagnosis not present

## 2021-02-06 DIAGNOSIS — I4891 Unspecified atrial fibrillation: Secondary | ICD-10-CM | POA: Diagnosis not present

## 2021-02-12 DIAGNOSIS — R1310 Dysphagia, unspecified: Secondary | ICD-10-CM | POA: Diagnosis not present

## 2021-02-12 DIAGNOSIS — F0391 Unspecified dementia with behavioral disturbance: Secondary | ICD-10-CM | POA: Diagnosis not present

## 2021-02-12 DIAGNOSIS — E441 Mild protein-calorie malnutrition: Secondary | ICD-10-CM | POA: Diagnosis not present

## 2021-02-12 DIAGNOSIS — R5381 Other malaise: Secondary | ICD-10-CM | POA: Diagnosis not present

## 2021-02-13 DIAGNOSIS — I4891 Unspecified atrial fibrillation: Secondary | ICD-10-CM | POA: Diagnosis not present

## 2021-02-20 DIAGNOSIS — R000 Tachycardia, unspecified: Secondary | ICD-10-CM | POA: Diagnosis not present

## 2021-02-20 DIAGNOSIS — Z9181 History of falling: Secondary | ICD-10-CM | POA: Diagnosis not present

## 2021-02-20 DIAGNOSIS — M109 Gout, unspecified: Secondary | ICD-10-CM | POA: Diagnosis present

## 2021-02-20 DIAGNOSIS — R404 Transient alteration of awareness: Secondary | ICD-10-CM | POA: Diagnosis not present

## 2021-02-20 DIAGNOSIS — R791 Abnormal coagulation profile: Secondary | ICD-10-CM | POA: Diagnosis present

## 2021-02-20 DIAGNOSIS — Z954 Presence of other heart-valve replacement: Secondary | ICD-10-CM | POA: Diagnosis not present

## 2021-02-20 DIAGNOSIS — Z79899 Other long term (current) drug therapy: Secondary | ICD-10-CM | POA: Diagnosis not present

## 2021-02-20 DIAGNOSIS — D696 Thrombocytopenia, unspecified: Secondary | ICD-10-CM | POA: Diagnosis present

## 2021-02-20 DIAGNOSIS — G9341 Metabolic encephalopathy: Secondary | ICD-10-CM | POA: Diagnosis present

## 2021-02-20 DIAGNOSIS — F03918 Unspecified dementia, unspecified severity, with other behavioral disturbance: Secondary | ICD-10-CM | POA: Diagnosis present

## 2021-02-20 DIAGNOSIS — M1990 Unspecified osteoarthritis, unspecified site: Secondary | ICD-10-CM | POA: Diagnosis present

## 2021-02-20 DIAGNOSIS — E43 Unspecified severe protein-calorie malnutrition: Secondary | ICD-10-CM | POA: Diagnosis not present

## 2021-02-20 DIAGNOSIS — R410 Disorientation, unspecified: Secondary | ICD-10-CM | POA: Diagnosis not present

## 2021-02-20 DIAGNOSIS — I6203 Nontraumatic chronic subdural hemorrhage: Secondary | ICD-10-CM | POA: Diagnosis present

## 2021-02-20 DIAGNOSIS — Z951 Presence of aortocoronary bypass graft: Secondary | ICD-10-CM | POA: Diagnosis not present

## 2021-02-20 DIAGNOSIS — Z6823 Body mass index (BMI) 23.0-23.9, adult: Secondary | ICD-10-CM | POA: Diagnosis not present

## 2021-02-20 DIAGNOSIS — E870 Hyperosmolality and hypernatremia: Secondary | ICD-10-CM | POA: Diagnosis not present

## 2021-02-20 DIAGNOSIS — Z66 Do not resuscitate: Secondary | ICD-10-CM | POA: Diagnosis present

## 2021-02-20 DIAGNOSIS — Z7901 Long term (current) use of anticoagulants: Secondary | ICD-10-CM | POA: Diagnosis not present

## 2021-02-20 DIAGNOSIS — Z880 Allergy status to penicillin: Secondary | ICD-10-CM | POA: Diagnosis not present

## 2021-02-20 DIAGNOSIS — R4182 Altered mental status, unspecified: Secondary | ICD-10-CM | POA: Diagnosis not present

## 2021-02-20 DIAGNOSIS — R0902 Hypoxemia: Secondary | ICD-10-CM | POA: Diagnosis not present

## 2021-02-20 DIAGNOSIS — Z743 Need for continuous supervision: Secondary | ICD-10-CM | POA: Diagnosis not present

## 2021-02-20 DIAGNOSIS — N289 Disorder of kidney and ureter, unspecified: Secondary | ICD-10-CM | POA: Diagnosis not present

## 2021-02-20 DIAGNOSIS — I10 Essential (primary) hypertension: Secondary | ICD-10-CM | POA: Diagnosis present

## 2021-02-20 DIAGNOSIS — Z20822 Contact with and (suspected) exposure to covid-19: Secondary | ICD-10-CM | POA: Diagnosis present

## 2021-02-20 DIAGNOSIS — E860 Dehydration: Secondary | ICD-10-CM | POA: Diagnosis not present

## 2021-02-20 DIAGNOSIS — I2510 Atherosclerotic heart disease of native coronary artery without angina pectoris: Secondary | ICD-10-CM | POA: Diagnosis present

## 2021-02-20 DIAGNOSIS — N170 Acute kidney failure with tubular necrosis: Secondary | ICD-10-CM | POA: Diagnosis present

## 2021-02-27 DIAGNOSIS — N390 Urinary tract infection, site not specified: Secondary | ICD-10-CM | POA: Diagnosis not present

## 2021-02-27 DIAGNOSIS — E870 Hyperosmolality and hypernatremia: Secondary | ICD-10-CM | POA: Diagnosis not present

## 2021-03-03 DIAGNOSIS — G301 Alzheimer's disease with late onset: Secondary | ICD-10-CM | POA: Diagnosis not present

## 2021-03-03 DIAGNOSIS — Z87440 Personal history of urinary (tract) infections: Secondary | ICD-10-CM | POA: Diagnosis not present

## 2021-03-03 DIAGNOSIS — R4020 Unspecified coma: Secondary | ICD-10-CM | POA: Diagnosis not present

## 2021-03-03 DIAGNOSIS — R1310 Dysphagia, unspecified: Secondary | ICD-10-CM | POA: Diagnosis not present

## 2021-03-03 DIAGNOSIS — I110 Hypertensive heart disease with heart failure: Secondary | ICD-10-CM | POA: Diagnosis not present

## 2021-03-03 DIAGNOSIS — Z87820 Personal history of traumatic brain injury: Secondary | ICD-10-CM | POA: Diagnosis not present

## 2021-03-03 DIAGNOSIS — F02C11 Dementia in other diseases classified elsewhere, severe, with agitation: Secondary | ICD-10-CM | POA: Diagnosis not present

## 2021-03-03 DIAGNOSIS — R0902 Hypoxemia: Secondary | ICD-10-CM | POA: Diagnosis not present

## 2021-03-03 DIAGNOSIS — E43 Unspecified severe protein-calorie malnutrition: Secondary | ICD-10-CM | POA: Diagnosis not present

## 2021-03-03 DIAGNOSIS — D696 Thrombocytopenia, unspecified: Secondary | ICD-10-CM | POA: Diagnosis not present

## 2021-03-03 DIAGNOSIS — Z7401 Bed confinement status: Secondary | ICD-10-CM | POA: Diagnosis not present

## 2021-03-03 DIAGNOSIS — I2510 Atherosclerotic heart disease of native coronary artery without angina pectoris: Secondary | ICD-10-CM | POA: Diagnosis not present

## 2021-03-03 DIAGNOSIS — R630 Anorexia: Secondary | ICD-10-CM | POA: Diagnosis not present

## 2021-03-03 DIAGNOSIS — Z954 Presence of other heart-valve replacement: Secondary | ICD-10-CM | POA: Diagnosis not present

## 2021-03-03 DIAGNOSIS — R404 Transient alteration of awareness: Secondary | ICD-10-CM | POA: Diagnosis not present

## 2021-03-03 DIAGNOSIS — I509 Heart failure, unspecified: Secondary | ICD-10-CM | POA: Diagnosis not present

## 2021-03-04 DIAGNOSIS — F02C11 Dementia in other diseases classified elsewhere, severe, with agitation: Secondary | ICD-10-CM | POA: Diagnosis not present

## 2021-03-04 DIAGNOSIS — E43 Unspecified severe protein-calorie malnutrition: Secondary | ICD-10-CM | POA: Diagnosis not present

## 2021-03-04 DIAGNOSIS — Z87440 Personal history of urinary (tract) infections: Secondary | ICD-10-CM | POA: Diagnosis not present

## 2021-03-04 DIAGNOSIS — G301 Alzheimer's disease with late onset: Secondary | ICD-10-CM | POA: Diagnosis not present

## 2021-03-04 DIAGNOSIS — R630 Anorexia: Secondary | ICD-10-CM | POA: Diagnosis not present

## 2021-03-04 DIAGNOSIS — R1310 Dysphagia, unspecified: Secondary | ICD-10-CM | POA: Diagnosis not present

## 2021-03-05 DIAGNOSIS — Z87440 Personal history of urinary (tract) infections: Secondary | ICD-10-CM | POA: Diagnosis not present

## 2021-03-05 DIAGNOSIS — R1310 Dysphagia, unspecified: Secondary | ICD-10-CM | POA: Diagnosis not present

## 2021-03-05 DIAGNOSIS — F02C11 Dementia in other diseases classified elsewhere, severe, with agitation: Secondary | ICD-10-CM | POA: Diagnosis not present

## 2021-03-05 DIAGNOSIS — E43 Unspecified severe protein-calorie malnutrition: Secondary | ICD-10-CM | POA: Diagnosis not present

## 2021-03-05 DIAGNOSIS — G301 Alzheimer's disease with late onset: Secondary | ICD-10-CM | POA: Diagnosis not present

## 2021-03-05 DIAGNOSIS — R630 Anorexia: Secondary | ICD-10-CM | POA: Diagnosis not present

## 2021-03-06 DIAGNOSIS — E43 Unspecified severe protein-calorie malnutrition: Secondary | ICD-10-CM | POA: Diagnosis not present

## 2021-03-06 DIAGNOSIS — G301 Alzheimer's disease with late onset: Secondary | ICD-10-CM | POA: Diagnosis not present

## 2021-03-06 DIAGNOSIS — R1310 Dysphagia, unspecified: Secondary | ICD-10-CM | POA: Diagnosis not present

## 2021-03-06 DIAGNOSIS — F02C11 Dementia in other diseases classified elsewhere, severe, with agitation: Secondary | ICD-10-CM | POA: Diagnosis not present

## 2021-03-06 DIAGNOSIS — R630 Anorexia: Secondary | ICD-10-CM | POA: Diagnosis not present

## 2021-03-06 DIAGNOSIS — Z87440 Personal history of urinary (tract) infections: Secondary | ICD-10-CM | POA: Diagnosis not present

## 2021-03-17 DEATH — deceased
